# Patient Record
Sex: Female | Born: 1945 | Race: White | Hispanic: No | Marital: Married | Smoking: Never smoker
Health system: Southern US, Community
[De-identification: ages and names within clinical notes are randomized; demographics above are authoritative.]

## PROBLEM LIST (undated history)

## (undated) DIAGNOSIS — E079 Disorder of thyroid, unspecified: Secondary | ICD-10-CM

## (undated) DIAGNOSIS — J45909 Unspecified asthma, uncomplicated: Secondary | ICD-10-CM

## (undated) DIAGNOSIS — E039 Hypothyroidism, unspecified: Secondary | ICD-10-CM

## (undated) HISTORY — PX: ABDOMINAL HYSTERECTOMY: SHX81

---

## 1999-08-05 HISTORY — PX: NASAL POLYP SURGERY: SHX186

## 2018-09-07 ENCOUNTER — Emergency Department (HOSPITAL_BASED_OUTPATIENT_CLINIC_OR_DEPARTMENT_OTHER): Payer: PRIVATE HEALTH INSURANCE

## 2018-09-07 ENCOUNTER — Inpatient Hospital Stay (HOSPITAL_BASED_OUTPATIENT_CLINIC_OR_DEPARTMENT_OTHER)
Admission: EM | Admit: 2018-09-07 | Discharge: 2018-09-12 | DRG: 327 | Disposition: A | Discharge status: Home / Self Care | Payer: PRIVATE HEALTH INSURANCE | Attending: Family Medicine | Admitting: Family Medicine

## 2018-09-07 ENCOUNTER — Encounter (HOSPITAL_BASED_OUTPATIENT_CLINIC_OR_DEPARTMENT_OTHER): Payer: Self-pay

## 2018-09-07 ENCOUNTER — Other Ambulatory Visit: Payer: Self-pay

## 2018-09-07 ENCOUNTER — Inpatient Hospital Stay (HOSPITAL_COMMUNITY): Payer: PRIVATE HEALTH INSURANCE

## 2018-09-07 DIAGNOSIS — K440 Diaphragmatic hernia with obstruction, without gangrene: Principal | ICD-10-CM | POA: Diagnosis present

## 2018-09-07 DIAGNOSIS — K219 Gastro-esophageal reflux disease without esophagitis: Secondary | ICD-10-CM | POA: Diagnosis present

## 2018-09-07 DIAGNOSIS — Z7951 Long term (current) use of inhaled steroids: Secondary | ICD-10-CM | POA: Diagnosis not present

## 2018-09-07 DIAGNOSIS — E039 Hypothyroidism, unspecified: Secondary | ICD-10-CM

## 2018-09-07 DIAGNOSIS — Z8042 Family history of malignant neoplasm of prostate: Secondary | ICD-10-CM

## 2018-09-07 DIAGNOSIS — R7989 Other specified abnormal findings of blood chemistry: Secondary | ICD-10-CM | POA: Diagnosis present

## 2018-09-07 DIAGNOSIS — Z79890 Hormone replacement therapy (postmenopausal): Secondary | ICD-10-CM | POA: Diagnosis not present

## 2018-09-07 DIAGNOSIS — Z90710 Acquired absence of both cervix and uterus: Secondary | ICD-10-CM

## 2018-09-07 DIAGNOSIS — K311 Adult hypertrophic pyloric stenosis: Secondary | ICD-10-CM | POA: Diagnosis present

## 2018-09-07 DIAGNOSIS — Z789 Other specified health status: Secondary | ICD-10-CM

## 2018-09-07 DIAGNOSIS — J45909 Unspecified asthma, uncomplicated: Secondary | ICD-10-CM | POA: Diagnosis present

## 2018-09-07 DIAGNOSIS — Z880 Allergy status to penicillin: Secondary | ICD-10-CM

## 2018-09-07 DIAGNOSIS — Z806 Family history of leukemia: Secondary | ICD-10-CM

## 2018-09-07 DIAGNOSIS — R109 Unspecified abdominal pain: Secondary | ICD-10-CM | POA: Diagnosis not present

## 2018-09-07 DIAGNOSIS — Z79899 Other long term (current) drug therapy: Secondary | ICD-10-CM | POA: Diagnosis not present

## 2018-09-07 DIAGNOSIS — R778 Other specified abnormalities of plasma proteins: Secondary | ICD-10-CM | POA: Diagnosis present

## 2018-09-07 DIAGNOSIS — Z7289 Other problems related to lifestyle: Secondary | ICD-10-CM

## 2018-09-07 DIAGNOSIS — F1090 Alcohol use, unspecified, uncomplicated: Secondary | ICD-10-CM

## 2018-09-07 DIAGNOSIS — Z803 Family history of malignant neoplasm of breast: Secondary | ICD-10-CM

## 2018-09-07 DIAGNOSIS — Z885 Allergy status to narcotic agent status: Secondary | ICD-10-CM | POA: Diagnosis not present

## 2018-09-07 DIAGNOSIS — J4520 Mild intermittent asthma, uncomplicated: Secondary | ICD-10-CM | POA: Diagnosis not present

## 2018-09-07 DIAGNOSIS — K3189 Other diseases of stomach and duodenum: Secondary | ICD-10-CM | POA: Diagnosis present

## 2018-09-07 DIAGNOSIS — F1010 Alcohol abuse, uncomplicated: Secondary | ICD-10-CM | POA: Diagnosis present

## 2018-09-07 DIAGNOSIS — Z4659 Encounter for fitting and adjustment of other gastrointestinal appliance and device: Secondary | ICD-10-CM

## 2018-09-07 HISTORY — DX: Unspecified asthma, uncomplicated: J45.909

## 2018-09-07 HISTORY — DX: Hypothyroidism, unspecified: E03.9

## 2018-09-07 HISTORY — DX: Disorder of thyroid, unspecified: E07.9

## 2018-09-07 LAB — CBC
HCT: 42.6 % (ref 36.0–46.0)
Hemoglobin: 14.1 g/dL (ref 12.0–15.0)
MCH: 30.9 pg (ref 26.0–34.0)
MCHC: 33.1 g/dL (ref 30.0–36.0)
MCV: 93.4 fL (ref 80.0–100.0)
Platelets: 270 10*3/uL (ref 150–400)
RBC: 4.56 MIL/uL (ref 3.87–5.11)
RDW: 13.2 % (ref 11.5–15.5)
WBC: 7.1 10*3/uL (ref 4.0–10.5)
nRBC: 0 % (ref 0.0–0.2)

## 2018-09-07 LAB — COMPREHENSIVE METABOLIC PANEL
ALT: 13 U/L (ref 0–44)
AST: 22 U/L (ref 15–41)
Albumin: 3.7 g/dL (ref 3.5–5.0)
Alkaline Phosphatase: 50 U/L (ref 38–126)
Anion gap: 10 (ref 5–15)
BUN: 20 mg/dL (ref 8–23)
CHLORIDE: 94 mmol/L — AB (ref 98–111)
CO2: 27 mmol/L (ref 22–32)
CREATININE: 0.66 mg/dL (ref 0.44–1.00)
Calcium: 8.5 mg/dL — ABNORMAL LOW (ref 8.9–10.3)
GFR calc Af Amer: >60 mL/min (ref >60)
GFR calc non Af Amer: >60 mL/min (ref >60)
Glucose, Bld: 88 mg/dL (ref 70–99)
Potassium: 4 mmol/L (ref 3.5–5.1)
Sodium: 131 mmol/L — ABNORMAL LOW (ref 135–145)
Total Bilirubin: 2.1 mg/dL — ABNORMAL HIGH (ref 0.3–1.2)
Total Protein: 6.6 g/dL (ref 6.5–8.1)

## 2018-09-07 LAB — LIPASE, BLOOD: Lipase: 191 U/L — ABNORMAL HIGH (ref 11–51)

## 2018-09-07 LAB — URINALYSIS, ROUTINE W REFLEX MICROSCOPIC
Bilirubin Urine: NEGATIVE
Glucose, UA: NEGATIVE mg/dL
HGB URINE DIPSTICK: NEGATIVE
Ketones, ur: 40 mg/dL — AB
Nitrite: NEGATIVE
Protein, ur: NEGATIVE mg/dL
pH: 6 (ref 5.0–8.0)

## 2018-09-07 LAB — URINALYSIS, MICROSCOPIC (REFLEX)

## 2018-09-07 LAB — TROPONIN I
Troponin I: 0.05 ng/mL (ref <0.03)
Troponin I: 0.04 ng/mL (ref <0.03)

## 2018-09-07 LAB — LACTIC ACID, PLASMA: Lactic Acid, Venous: 1.4 mmol/L (ref 0.5–1.9)

## 2018-09-07 MED ORDER — THIAMINE HCL 100 MG/ML IJ SOLN
100.0000 mg | Freq: Every day | INTRAMUSCULAR | Status: DC
  Administered 2018-09-11 – 2018-09-12 (×2): 100 mg via INTRAVENOUS
  Filled 2018-09-07 (×2): qty 2

## 2018-09-07 MED ORDER — LEVOTHYROXINE SODIUM 100 MCG/5ML IV SOLN
50.0000 ug | Freq: Every day | INTRAVENOUS | Status: DC
  Administered 2018-09-08 – 2018-09-12 (×4): 50 ug via INTRAVENOUS
  Filled 2018-09-07 (×5): qty 5

## 2018-09-07 MED ORDER — VITAMIN B-1 100 MG PO TABS
100.0000 mg | ORAL_TABLET | Freq: Every day | ORAL | Status: DC
  Administered 2018-09-08 – 2018-09-09 (×2): 100 mg via ORAL
  Filled 2018-09-07 (×2): qty 1

## 2018-09-07 MED ORDER — ONDANSETRON HCL 4 MG/2ML IJ SOLN
4.0000 mg | Freq: Three times a day (TID) | INTRAMUSCULAR | Status: DC | PRN
  Administered 2018-09-07 – 2018-09-10 (×2): 4 mg via INTRAVENOUS
  Filled 2018-09-07 (×2): qty 2

## 2018-09-07 MED ORDER — ACETAMINOPHEN 650 MG RE SUPP: 650.0000 mg | Freq: Four times a day (QID) | RECTAL | Status: DC | PRN

## 2018-09-07 MED ORDER — FOLIC ACID 1 MG PO TABS
1.0000 mg | ORAL_TABLET | Freq: Every day | ORAL | Status: DC
  Administered 2018-09-08 – 2018-09-12 (×4): 1 mg via ORAL
  Filled 2018-09-07 (×4): qty 1

## 2018-09-07 MED ORDER — LORAZEPAM 2 MG/ML IJ SOLN: 0.0000 mg | Freq: Four times a day (QID) | INTRAMUSCULAR | Status: AC

## 2018-09-07 MED ORDER — NITROGLYCERIN 0.4 MG SL SUBL: 0.4000 mg | SUBLINGUAL_TABLET | SUBLINGUAL | Status: DC | PRN

## 2018-09-07 MED ORDER — LIDOCAINE VISCOUS HCL 2 % MT SOLN
OROMUCOSAL | Status: AC
  Filled 2018-09-07: qty 15

## 2018-09-07 MED ORDER — ADULT MULTIVITAMIN W/MINERALS CH
1.0000 | ORAL_TABLET | Freq: Every day | ORAL | Status: DC
  Administered 2018-09-08 – 2018-09-12 (×4): 1 via ORAL
  Filled 2018-09-07 (×4): qty 1

## 2018-09-07 MED ORDER — LORAZEPAM 2 MG/ML IJ SOLN
1.0000 mg | Freq: Four times a day (QID) | INTRAMUSCULAR | Status: AC | PRN
  Administered 2018-09-08: 1 mg via INTRAVENOUS
  Filled 2018-09-07: qty 1

## 2018-09-07 MED ORDER — BENZOCAINE 20 % MT AERO
INHALATION_SPRAY | OROMUCOSAL | Status: AC
  Filled 2018-09-07: qty 57

## 2018-09-07 MED ORDER — LORAZEPAM 1 MG PO TABS: 1.0000 mg | ORAL_TABLET | Freq: Four times a day (QID) | ORAL | Status: AC | PRN

## 2018-09-07 MED ORDER — ALBUTEROL SULFATE (2.5 MG/3ML) 0.083% IN NEBU: 2.5000 mg | INHALATION_SOLUTION | RESPIRATORY_TRACT | Status: DC | PRN

## 2018-09-07 MED ORDER — PANTOPRAZOLE SODIUM 40 MG IV SOLR
40.0000 mg | Freq: Once | INTRAVENOUS | Status: AC
  Administered 2018-09-07: 40 mg via INTRAVENOUS
  Filled 2018-09-07: qty 40

## 2018-09-07 MED ORDER — ACETAMINOPHEN 325 MG PO TABS: 650.0000 mg | ORAL_TABLET | Freq: Four times a day (QID) | ORAL | Status: DC | PRN

## 2018-09-07 MED ORDER — ONDANSETRON HCL 4 MG/2ML IJ SOLN
4.0000 mg | Freq: Once | INTRAMUSCULAR | Status: AC
  Administered 2018-09-07: 4 mg via INTRAVENOUS
  Filled 2018-09-07: qty 2

## 2018-09-07 MED ORDER — PANTOPRAZOLE SODIUM 40 MG IV SOLR
40.0000 mg | Freq: Two times a day (BID) | INTRAVENOUS | Status: DC
  Administered 2018-09-08 – 2018-09-12 (×8): 40 mg via INTRAVENOUS
  Filled 2018-09-07 (×8): qty 40

## 2018-09-07 MED ORDER — SODIUM CHLORIDE 0.9% FLUSH
3.0000 mL | Freq: Once | INTRAVENOUS | Status: DC
  Filled 2018-09-07: qty 3

## 2018-09-07 MED ORDER — MORPHINE SULFATE (PF) 4 MG/ML IV SOLN
4.0000 mg | Freq: Once | INTRAVENOUS | Status: DC
  Filled 2018-09-07: qty 1

## 2018-09-07 MED ORDER — LORAZEPAM 2 MG/ML IJ SOLN: 0.0000 mg | Freq: Two times a day (BID) | INTRAMUSCULAR | Status: AC

## 2018-09-07 MED ORDER — LACTATED RINGERS IV BOLUS
1000.0000 mL | Freq: Once | INTRAVENOUS | Status: AC
  Administered 2018-09-07: 1000 mL via INTRAVENOUS

## 2018-09-07 MED ORDER — FLUTICASONE FUROATE-VILANTEROL 100-25 MCG/INH IN AEPB
1.0000 | INHALATION_SPRAY | Freq: Every day | RESPIRATORY_TRACT | Status: DC
  Administered 2018-09-08 – 2018-09-09 (×2): 1 via RESPIRATORY_TRACT
  Filled 2018-09-07: qty 28

## 2018-09-07 MED ORDER — SODIUM CHLORIDE 0.9 % IV SOLN
INTRAVENOUS | Status: DC
  Administered 2018-09-07 – 2018-09-08 (×2): via INTRAVENOUS

## 2018-09-07 MED ORDER — IOPAMIDOL (ISOVUE-300) INJECTION 61%
100.0000 mL | Freq: Once | INTRAVENOUS | Status: AC | PRN
  Administered 2018-09-07: 100 mL via INTRAVENOUS

## 2018-09-07 NOTE — ED Notes (Signed)
C/o vomiting x 5 days  w increased abd pain and abd distension

## 2018-09-07 NOTE — ED Notes (Signed)
Report given to [facility] 

## 2018-09-07 NOTE — ED Notes (Signed)
Patient transported to CT 

## 2018-09-07 NOTE — ED Notes (Signed)
[facility] notified (Tara) - patient ready for transport 

## 2018-09-07 NOTE — ED Notes (Signed)
Called lab and let them know to add on troponin

## 2018-09-07 NOTE — ED Provider Notes (Signed)
[facility] EMERGENCY DEPARTMENT Provider Note   CSN: [id number] Arrival date & time: 09/07/18  1317     History   Chief Complaint Chief Complaint  Patient presents with  . Abdominal Pain    HPI Elizabeth Banks is a 73 y.o. female.  Patient is a 73 year old female with past medical history of asthma, thyroid disease, hiatal hernia who presents emergency department for epigastric pain, nausea, vomiting.  She reports that she resides in [place] but has been visiting her son in the past 6 weeks.  Reports of the last 5 days she has had uncontrollable vomiting, nausea, epigastric pain.  Denies any fever, chills, diarrhea, dysuria.  Reports that in the past she has had some similar episodes but not this severe and never associated with epigastric pain.  She reports that she has seen GI specialist in the past who has done upper and lower GI studies, barium studies.  She reports that this revealed a hiatal hernia but no other significant problems.  Reports that she is on "a stomach medicine".  But does not know what it is.  Reports that she thinks this episode might of been flared up by eating a lot of pizza over the weekend.  She reports that she felt dehydrated and had a family friend gave her some IV fluids and some Phenergan last night which did improve her but that she is still having some decreased urine output today.  She denies any chest pain, shortness of breath, bloody vomit, bilious vomit.     Past Medical History:  Diagnosis Date  . Asthma   . Thyroid disease     Patient Active Problem List   Diagnosis Date Noted  . 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 ABDOMINAL HYSTERECTOMY    . NASAL POLYP SURGERY  2001     OB History   No obstetric history on file.      Home Medications    Prior to Admission medications   Medication Sig Start Date End Date Taking? Authorizing Provider  IRON PO Take by mouth.   Yes  [provider]  fluticasone furoate-vilanterol (BREO ELLIPTA) 100-25 MCG/INH AEPB Inhale into the lungs.    [provider]  levothyroxine (SYNTHROID, LEVOTHROID) 100 MCG tablet Take by mouth.    [provider]  pantoprazole (PROTONIX) 40 MG tablet Take by mouth.    [provider]    Family History No family history on file.  Social History Social History   Tobacco Use  . Smoking status: Never Smoker  . Smokeless tobacco: Never Used  Substance Use Topics  . Alcohol use: Yes    Comment: occ  . Drug use: Never     Allergies   Morphine and related; Other; and Penicillins   Review of Systems Review of Systems  Constitutional: Positive for appetite change. Negative for activity change, chills, diaphoresis, fatigue, fever and unexpected weight change.  HENT: Negative.   Respiratory: Negative for cough and shortness of breath.   Cardiovascular: Negative for chest pain.  Gastrointestinal: Positive for abdominal pain, nausea and vomiting. Negative for blood in stool, constipation, diarrhea and rectal pain.  Endocrine: Negative for polyuria.  Genitourinary: Positive for decreased urine volume. Negative for difficulty urinating, dysuria, flank pain, pelvic pain, urgency, vaginal bleeding, vaginal discharge and vaginal pain.  Musculoskeletal: Negative for arthralgias, back pain and myalgias.  Skin: Negative for rash.  Allergic/Immunologic: Negative for immunocompromised state.  Neurological: Negative for  dizziness, weakness, light-headedness and headaches.  Hematological: Negative for adenopathy. Does not bruise/bleed easily.     Physical Exam Updated Vital Signs BP (!) 145/60   Pulse 70   Temp 98.7 F (37.1 C) (Oral)   Resp 11   Ht 4\' 11"  (1.499 m)   Wt 57.2 kg   SpO2 92%   BMI 25.45 kg/m   Physical Exam Vitals signs and nursing note reviewed.  Constitutional:      General: She is not in acute distress.    Appearance: She is  well-developed and normal weight. She is not ill-appearing, toxic-appearing or diaphoretic.  HENT:     Head: Normocephalic and atraumatic.     Nose: Nose normal.     Mouth/Throat:     Mouth: Mucous membranes are moist.  Eyes:     Conjunctiva/sclera: Conjunctivae normal.     Pupils: Pupils are equal, round, and reactive to light.  Cardiovascular:     Rate and Rhythm: Normal rate and regular rhythm.     Heart sounds: Normal heart sounds.  Pulmonary:     Effort: Pulmonary effort is normal.     Breath sounds: Normal breath sounds. No wheezing, rhonchi or rales.  Abdominal:     General: Abdomen is flat. Bowel sounds are normal. There is no distension.     Palpations: Abdomen is soft.     Tenderness: There is abdominal tenderness in the epigastric area. There is no right CVA tenderness, left CVA tenderness, guarding or rebound. Negative signs include Murphy's sign.  Musculoskeletal:     Right lower leg: No edema.     Left lower leg: No edema.  Skin:    General: Skin is warm.     Capillary Refill: Capillary refill takes less than 2 seconds.  Neurological:     General: No focal deficit present.     Mental Status: She is alert.  Psychiatric:        Mood and Affect: Mood normal.      ED Treatments / Results  Labs (all labs ordered are listed, but only abnormal results are displayed) Labs Reviewed  TROPONIN I - Abnormal; Notable for the following components:      Result Value   Troponin I 0.05 (*)    All other components within normal limits  LIPASE, BLOOD - Abnormal; Notable for the following components:   Lipase 191 (*)    All other components within normal limits  URINALYSIS, ROUTINE W REFLEX MICROSCOPIC - Abnormal; Notable for the following components:   Specific Gravity, Urine <1.005 (*)    Ketones, ur 40 (*)    Leukocytes, UA TRACE (*)    All other components within normal limits  COMPREHENSIVE METABOLIC PANEL - Abnormal; Notable for the following components:   Sodium  131 (*)    Chloride 94 (*)    Calcium 8.5 (*)    Total Bilirubin 2.1 (*)    All other components within normal limits  TROPONIN I - Abnormal; Notable for the following components:   Troponin I 0.04 (*)    All other components within normal limits  URINALYSIS, MICROSCOPIC (REFLEX) - Abnormal; Notable for the following components:   Bacteria, UA RARE (*)    All other components within normal limits  CBC  LACTIC ACID, PLASMA    EKG EKG Interpretation  Date/Time:  Tuesday September 07 2018 13:34:19 EST Ventricular Rate:  82 PR Interval:  146 QRS Duration: 88 QT Interval:  394 QTC Calculation: 460 R Axis:  55 Text Interpretation:  Normal sinus rhythm Right atrial enlargement Borderline ECG No old tracing to compare Confirmed by Linwood DibblesKnapp, Jon [id number]) on 09/07/2018 1:38:37 PM   Radiology Dg Chest 2 View  Result Date: 09/07/2018 CLINICAL DATA:  Chest pain and vomiting EXAM: CHEST - 2 VIEW COMPARISON:  None. FINDINGS: There is an air-fluid level above the diaphragm posterior to the right heart. There is also an apparent gastric air bubble on the left. There is atelectatic change in each lung base. The lungs elsewhere are clear. Heart size and pulmonary vascularity normal. No adenopathy. No bone lesions. IMPRESSION: Air-fluid level posterior to the right heart. Question large bulla containing fluid. Atypical lung abscess could present in this manner. An atypical appearing hernia is also a differential consideration. Note that there is a suspected gastric air bubble in the expected location in the left upper quadrant. Given this unusual appearance in the absence of prior studies to compare, CT of the chest and upper abdomen, ideally with oral contrast, is felt to be advisable to further assess. Mild bibasilar atelectasis. No frank airspace consolidation evident. Heart size normal. Electronically Signed   By: Bretta BangWilliam  Woodruff III M.D.   On: 09/07/2018 13:57   Ct Chest W Contrast  Result Date:  09/07/2018 CLINICAL DATA:  Epigastric pain.  Nausea and vomiting. EXAM: CT CHEST, ABDOMEN, AND PELVIS WITH CONTRAST TECHNIQUE: Multidetector CT imaging of the chest, abdomen and pelvis was performed following the standard protocol during bolus administration of intravenous contrast. CONTRAST:  100mL ISOVUE-300 IOPAMIDOL (ISOVUE-300) INJECTION 61% COMPARISON:  Two-view chest x-ray of the same day. FINDINGS: CT CHEST FINDINGS Cardiovascular: Heart size is normal. Coronary artery calcifications are present. A 3 vessel arch configuration is present. Atherosclerotic calcifications are present at the arch and in the descending aorta without aneurysm. Pulmonary arteries are within normal limits. Mediastinum/Nodes: Subcentimeter paratracheal lymph nodes are present. No significant mediastinal or hilar adenopathy is present. There is no significant axillary adenopathy. Thoracic inlet is normal. There is some inflammatory change of the distal esophagus with wall thickening. Lungs/Pleura: Atelectasis is present at the right base adjacent to the herniated stomach. The lungs are otherwise clear. No focal nodule, mass, or airspace disease is present. There is no significant pleural effusion or pneumothorax. Musculoskeletal: Vertebral body heights alignment are normal. No focal lytic or blastic lesions are present. Mild endplate degenerative changes are noted in the thoracic spine. CT ABDOMEN PELVIS FINDINGS Hepatobiliary: No focal liver abnormality is seen. No gallstones, gallbladder wall thickening, or biliary dilatation. Pancreas: Unremarkable. No pancreatic ductal dilatation or surrounding inflammatory changes. Spleen: Normal in size without focal abnormality. Adrenals/Urinary Tract: Adrenal glands are unremarkable. Kidneys are normal, without renal calculi, focal lesion, or hydronephrosis. Bladder is unremarkable. Stomach/Bowel: The stomach is massively dilated. The antrum extends superior to the GE junction within a  paraesophageal hernia. The duodenum extends superiorly into the hernia is well. Duodenum is normal in caliber. Small bowel is unremarkable. Distal small bowel is collapsed. Diverticular changes are present in the scratched at the ascending and transverse colon are within normal limits. Diverticular changes are present in the distal descending and sigmoid colon without inflammatory change. There is collapse the distal sigmoid colon. Vascular/Lymphatic: Atherosclerotic changes are present within the aorta and branch vessels. There is no aneurysm. Reproductive: Status post hysterectomy. No adnexal masses. Other: No other ventral hernia or free fluid is present. Musculoskeletal: Vertebral body heights alignment are normal. Degenerative changes are noted at the SI joints bilaterally and in the lower lumbar spine  facets. Hips are located and normal. IMPRESSION: 1. Marked dilation of the stomach with extension of the antrum into a paraesophageal hernia suggesting gastric volvulus. The stomach is obstructed. 2. Distal bowel is mostly collapsed. 3. Atelectasis the lung bases bilaterally. 4. Inflammatory changes involving the distal esophagus and proximal duodenum, potentially ischemic. 5.  Aortic Atherosclerosis (ICD10-I70.0). 6. Coronary artery disease These results were called by telephone at the time of interpretation on 09/07/2018 at 5:10 pm to Dr. Theda Belfast , who verbally acknowledged these results. Electronically Signed   By: Marin Roberts M.D.   On: 09/07/2018 17:11   Ct Abdomen Pelvis W Contrast  Result Date: 09/07/2018 CLINICAL DATA:  Epigastric pain.  Nausea and vomiting. EXAM: CT CHEST, ABDOMEN, AND PELVIS WITH CONTRAST TECHNIQUE: Multidetector CT imaging of the chest, abdomen and pelvis was performed following the standard protocol during bolus administration of intravenous contrast. CONTRAST:  ISOVUE-300 IOPAMIDOL (ISOVUE-300) INJECTION 61% COMPARISON:  Two-view chest x-ray of the same day.  FINDINGS: CT CHEST FINDINGS Cardiovascular: Heart size is normal. Coronary artery calcifications are present. A 3 vessel arch configuration is present. Atherosclerotic calcifications are present at the arch and in the descending aorta without aneurysm. Pulmonary arteries are within normal limits. Mediastinum/Nodes: Subcentimeter paratracheal lymph nodes are present. No significant mediastinal or hilar adenopathy is present. There is no significant axillary adenopathy. Thoracic inlet is normal. There is some inflammatory change of the distal esophagus with wall thickening. Lungs/Pleura: Atelectasis is present at the right base adjacent to the herniated stomach. The lungs are otherwise clear. No focal nodule, mass, or airspace disease is present. There is no significant pleural effusion or pneumothorax. Musculoskeletal: Vertebral body heights alignment are normal. No focal lytic or blastic lesions are present. Mild endplate degenerative changes are noted in the thoracic spine. CT ABDOMEN PELVIS FINDINGS Hepatobiliary: No focal liver abnormality is seen. No gallstones, gallbladder wall thickening, or biliary dilatation. Pancreas: Unremarkable. No pancreatic ductal dilatation or surrounding inflammatory changes. Spleen: Normal in size without focal abnormality. Adrenals/Urinary Tract: Adrenal glands are unremarkable. Kidneys are normal, without renal calculi, focal lesion, or hydronephrosis. Bladder is unremarkable. Stomach/Bowel: The stomach is massively dilated. The antrum extends superior to the GE junction within a paraesophageal hernia. The duodenum extends superiorly into the hernia is well. Duodenum is normal in caliber. Small bowel is unremarkable. Distal small bowel is collapsed. Diverticular changes are present in the scratched at the ascending and transverse colon are within normal limits. Diverticular changes are present in the distal descending and sigmoid colon without inflammatory change. There is  collapse the distal sigmoid colon. Vascular/Lymphatic: Atherosclerotic changes are present within the aorta and branch vessels. There is no aneurysm. Reproductive: Status post hysterectomy. No adnexal masses. Other: No other ventral hernia or free fluid is present. Musculoskeletal: Vertebral body heights alignment are normal. Degenerative changes are noted at the SI joints bilaterally and in the lower lumbar spine facets. Hips are located and normal. IMPRESSION: 1. Marked dilation of the stomach with extension of the antrum into a paraesophageal hernia suggesting gastric volvulus. The stomach is obstructed. 2. Distal bowel is mostly collapsed. 3. Atelectasis the lung bases bilaterally. 4. Inflammatory changes involving the distal esophagus and proximal duodenum, potentially ischemic. 5.  Aortic Atherosclerosis (ICD10-I70.0). 6. Coronary artery disease These results were called by telephone at the time of interpretation on 09/07/2018 at 5:10 pm to Dr. Theda Belfast , who verbally acknowledged these results. Electronically Signed   By: Marin Roberts M.D.   On: 09/07/2018 17:11  Procedures Procedures (including critical care time)  Medications Ordered in ED Medications  Benzocaine (HURRCAINE) 20 % mouth spray (has no administration in time range)  lidocaine (XYLOCAINE) 2 % viscous mouth solution (has no administration in time range)  morphine 4 MG/ML injection 4 mg (4 mg Intravenous Refused 09/07/18 1930)  lactated ringers bolus 1,000 mL (0 mLs Intravenous Stopped 09/07/18 1720)  ondansetron (ZOFRAN) injection 4 mg (4 mg Intravenous Given 09/07/18 1530)  pantoprazole (PROTONIX) injection 40 mg (40 mg Intravenous Given 09/07/18 1532)  iopamidol (ISOVUE-300) 61 % injection 100 mL (100 mLs Intravenous Contrast Given 09/07/18 1626)  ondansetron (ZOFRAN) injection 4 mg (4 mg Intravenous Given 09/07/18 1922)     Initial Impression / Assessment and Plan / ED Course  I have reviewed the triage vital signs and  the nursing notes.  Pertinent labs & imaging results that were available during my care of the patient were reviewed by me and considered in my medical decision making (see chart for details).  Clinical Course as of Sep 07 2001  Tue Sep 07, 2018  1420 Case discussed with Dr. Lynelle Doctor. Plan agreed upon   [KM]  1718 Consult placed for general surgeon based on CT findings. Patient has gastric volvulus with gastric obstruction. Currently NPO. Currently she is stable, pain controlled.    [KM]  1814 Spoke with Dr. Derrell Lolling, surgery who recommended NPO and NG tube. Dr. Mikeal Hawthorne to admit the patient on medicine floor. Patient remains stable.    [KM]  1903 Unable to place NG tube due to patient hx of nasal polyps. Ordering pain meds at this time.    [KM]    Clinical Course User Index [KM] Arlyn Dunning, PA-C    Patient care passed to Hospitalist, and Dr. Rockne Coons, waiting for transport to [facility]      Clinical Impression: 1. Gastric volvulus     Disposition: Admit    This note was prepared with assistance of Dragon voice recognition software. Occasional wrong-word or sound-a-like substitutions may have occurred due to the inherent limitations of voice recognition software.   Final Clinical Impressions(s) / ED Diagnoses   Final diagnoses:  Gastric volvulus    ED Discharge Orders    None       Jeral Pinch 09/07/18 2003    Tegeler, Canary Brim, MD 09/08/18 [phone]

## 2018-09-07 NOTE — ED Triage Notes (Signed)
C/o epigastric pain, n/x x 3 days-NAD- to triage in w/c-family states pt was given IVF at home yesterday

## 2018-09-07 NOTE — ED Notes (Signed)
attempted to call report to [facility] nurse not available

## 2018-09-07 NOTE — Consult Note (Signed)
Surgical Consultation Requesting provider: Dr. Mikeal Hawthorne  CC: gastric outlet obstruction  HPI: This is a relatively healthy 72yo woman who has been experiencing nausea, emesis, epigastric fullness/ pain since Thursday last week (6 days). She has had similar symptoms last year and sought evaluation by a GI- by the time she saw him and got worked up her symptoms had resolved, imaging studies did reveal a hiatal hernia but nothing concerning and she was told she had cyclic vomiting syndrome. She was prescribed some type of medication and has had no issues until now. She denies fevers. Her last emesis was yesterday afternoon. She has not been able to tolerate any PO. She continues to feel a pressure-like pain in her upper abdomen. CT shows GOO secondary to hiatal hernia. NG insertion via left nare unsuccessful in [facility] due to hx of nasal polyps; they did not try the right side.  No prior abdominal surgeries.  She lives in [place]- was here visiting her son for 6 wks and was due to go home this Friday.   Allergies  Allergen Reactions  . Morphine And Related Other (See Comments)    hallucinations  . Other     "some pain pill"  . Penicillins     Past Medical History:  Diagnosis Date  . Asthma   . Thyroid disease     Past Surgical History:  Procedure Laterality Date  . ABDOMINAL HYSTERECTOMY    . NASAL POLYP SURGERY  2001    No family history on file.  Social History   Socioeconomic History  . Marital status: Married    Spouse name: Not on file  . Number of children: Not on file  . Years of education: Not on file  . Highest education level: Not on file  Occupational History  . Not on file  Social Needs  . Financial resource strain: Not on file  . Food insecurity:    Worry: Not on file    Inability: Not on file  . Transportation needs:    Medical: Not on file    Non-medical: Not on file  Tobacco Use  . Smoking status: Never Smoker  . Smokeless tobacco: Never Used   Substance and Sexual Activity  . Alcohol use: Yes    Comment: occ  . Drug use: Never  . Sexual activity: Not on file  Lifestyle  . Physical activity:    Days per week: Not on file    Minutes per session: Not on file  . Stress: Not on file  Relationships  . Social connections:    Talks on phone: Not on file    Gets together: Not on file    Attends religious service: Not on file    Active member of club or organization: Not on file    Attends meetings of clubs or organizations: Not on file    Relationship status: Not on file  Other Topics Concern  . Not on file  Social History Narrative  . Not on file    No current facility-administered medications on file prior to encounter.    Current Outpatient Medications on File Prior to Encounter  Medication Sig Dispense Refill  . IRON PO Take by mouth.    . fluticasone furoate-vilanterol (BREO ELLIPTA) 100-25 MCG/INH AEPB Inhale into the lungs.    Marland Kitchen levothyroxine (SYNTHROID, LEVOTHROID) 100 MCG tablet Take by mouth.    . pantoprazole (PROTONIX) 40 MG tablet Take by mouth.      Review of Systems: a complete,  10pt review of systems was completed with pertinent positives and negatives as documented in the HPI  Physical Exam: Vitals:   09/07/18 1800 09/07/18 2109  BP: (!) 145/60 (!) 143/74  Pulse: 71 70  Resp: 11   Temp:  99 F (37.2 C)  SpO2: 94% 96%   Gen: A&Ox3, no distress  Head: normocephalic, atraumatic Eyes: extraocular motions intact, anicteric.  Neck: supple without mass or thyromegaly Chest: unlabored respirations, symmetrical air entry, clear bilaterally   Cardiovascular: RRR with palpable distal pulses, no pedal edema Abdomen: soft, mildly distended and mildly tender upper abdomen without guarding or peritonitis. No mass or organomegaly.  Extremities: warm, without edema, no deformities  Neuro: grossly intact Psych: appropriate mood and affect, normal insight  Skin: warm and dry   CBC Latest Ref Rng & Units  09/07/2018  WBC 4.0 - 10.5 K/uL 7.1  Hemoglobin 12.0 - 15.0 g/dL 09.8  Hematocrit 11.9 - 46.0 % 42.6  Platelets 150 - 400 K/uL 270    CMP Latest Ref Rng & Units 09/07/2018  Glucose 70 - 99 mg/dL 88  BUN 8 - 23 mg/dL 20  Creatinine 1.47 - 8.29 mg/dL 5.62  Sodium 130 - 865 mmol/L 131(L)  Potassium 3.5 - 5.1 mmol/L 4.0  Chloride 98 - 111 mmol/L 94(L)  CO2 22 - 32 mmol/L 27  Calcium 8.9 - 10.3 mg/dL 7.8(I)  Total Protein 6.5 - 8.1 g/dL 6.6  Total Bilirubin 0.3 - 1.2 mg/dL 2.1(H)  Alkaline Phos 38 - 126 U/L 50  AST 15 - 41 U/L 22  ALT 0 - 44 U/L 13    No results found for: INR, PROTIME  Imaging: Dg Chest 2 View  Result Date: 09/07/2018 CLINICAL DATA:  Chest pain and vomiting EXAM: CHEST - 2 VIEW COMPARISON:  None. FINDINGS: There is an air-fluid level above the diaphragm posterior to the right heart. There is also an apparent gastric air bubble on the left. There is atelectatic change in each lung base. The lungs elsewhere are clear. Heart size and pulmonary vascularity normal. No adenopathy. No bone lesions. IMPRESSION: Air-fluid level posterior to the right heart. Question large bulla containing fluid. Atypical lung abscess could present in this manner. An atypical appearing hernia is also a differential consideration. Note that there is a suspected gastric air bubble in the expected location in the left upper quadrant. Given this unusual appearance in the absence of prior studies to compare, CT of the chest and upper abdomen, ideally with oral contrast, is felt to be advisable to further assess. Mild bibasilar atelectasis. No frank airspace consolidation evident. Heart size normal. Electronically Signed   By: Bretta Bang III M.D.   On: 09/07/2018 13:57   Ct Chest W Contrast  Result Date: 09/07/2018 CLINICAL DATA:  Epigastric pain.  Nausea and vomiting. EXAM: CT CHEST, ABDOMEN, AND PELVIS WITH CONTRAST TECHNIQUE: Multidetector CT imaging of the chest, abdomen and pelvis was performed  following the standard protocol during bolus administration of intravenous contrast. CONTRAST:  ISOVUE-300 IOPAMIDOL (ISOVUE-300) INJECTION 61% COMPARISON:  Two-view chest x-ray of the same day. FINDINGS: CT CHEST FINDINGS Cardiovascular: Heart size is normal. Coronary artery calcifications are present. A 3 vessel arch configuration is present. Atherosclerotic calcifications are present at the arch and in the descending aorta without aneurysm. Pulmonary arteries are within normal limits. Mediastinum/Nodes: Subcentimeter paratracheal lymph nodes are present. No significant mediastinal or hilar adenopathy is present. There is no significant axillary adenopathy. Thoracic inlet is normal. There is some inflammatory change  of the distal esophagus with wall thickening. Lungs/Pleura: Atelectasis is present at the right base adjacent to the herniated stomach. The lungs are otherwise clear. No focal nodule, mass, or airspace disease is present. There is no significant pleural effusion or pneumothorax. Musculoskeletal: Vertebral body heights alignment are normal. No focal lytic or blastic lesions are present. Mild endplate degenerative changes are noted in the thoracic spine. CT ABDOMEN PELVIS FINDINGS Hepatobiliary: No focal liver abnormality is seen. No gallstones, gallbladder wall thickening, or biliary dilatation. Pancreas: Unremarkable. No pancreatic ductal dilatation or surrounding inflammatory changes. Spleen: Normal in size without focal abnormality. Adrenals/Urinary Tract: Adrenal glands are unremarkable. Kidneys are normal, without renal calculi, focal lesion, or hydronephrosis. Bladder is unremarkable. Stomach/Bowel: The stomach is massively dilated. The antrum extends superior to the GE junction within a paraesophageal hernia. The duodenum extends superiorly into the hernia is well. Duodenum is normal in caliber. Small bowel is unremarkable. Distal small bowel is collapsed. Diverticular changes are present  in the scratched at the ascending and transverse colon are within normal limits. Diverticular changes are present in the distal descending and sigmoid colon without inflammatory change. There is collapse the distal sigmoid colon. Vascular/Lymphatic: Atherosclerotic changes are present within the aorta and branch vessels. There is no aneurysm. Reproductive: Status post hysterectomy. No adnexal masses. Other: No other ventral hernia or free fluid is present. Musculoskeletal: Vertebral body heights alignment are normal. Degenerative changes are noted at the SI joints bilaterally and in the lower lumbar spine facets. Hips are located and normal. IMPRESSION: 1. Marked dilation of the stomach with extension of the antrum into a paraesophageal hernia suggesting gastric volvulus. The stomach is obstructed. 2. Distal bowel is mostly collapsed. 3. Atelectasis the lung bases bilaterally. 4. Inflammatory changes involving the distal esophagus and proximal duodenum, potentially ischemic. 5.  Aortic Atherosclerosis (ICD10-I70.0). 6. Coronary artery disease These results were called by telephone at the time of interpretation on 09/07/2018 at 5:10 pm to Dr. Theda Belfast , who verbally acknowledged these results. Electronically Signed   By: Marin Roberts M.D.   On: 09/07/2018 17:11   Ct Abdomen Pelvis W Contrast  Result Date: 09/07/2018 CLINICAL DATA:  Epigastric pain.  Nausea and vomiting. EXAM: CT CHEST, ABDOMEN, AND PELVIS WITH CONTRAST TECHNIQUE: Multidetector CT imaging of the chest, abdomen and pelvis was performed following the standard protocol during bolus administration of intravenous contrast. CONTRAST:  ISOVUE-300 IOPAMIDOL (ISOVUE-300) INJECTION 61% COMPARISON:  Two-view chest x-ray of the same day. FINDINGS: CT CHEST FINDINGS Cardiovascular: Heart size is normal. Coronary artery calcifications are present. A 3 vessel arch configuration is present. Atherosclerotic calcifications are present at the arch  and in the descending aorta without aneurysm. Pulmonary arteries are within normal limits. Mediastinum/Nodes: Subcentimeter paratracheal lymph nodes are present. No significant mediastinal or hilar adenopathy is present. There is no significant axillary adenopathy. Thoracic inlet is normal. There is some inflammatory change of the distal esophagus with wall thickening. Lungs/Pleura: Atelectasis is present at the right base adjacent to the herniated stomach. The lungs are otherwise clear. No focal nodule, mass, or airspace disease is present. There is no significant pleural effusion or pneumothorax. Musculoskeletal: Vertebral body heights alignment are normal. No focal lytic or blastic lesions are present. Mild endplate degenerative changes are noted in the thoracic spine. CT ABDOMEN PELVIS FINDINGS Hepatobiliary: No focal liver abnormality is seen. No gallstones, gallbladder wall thickening, or biliary dilatation. Pancreas: Unremarkable. No pancreatic ductal dilatation or surrounding inflammatory changes. Spleen: Normal in size without focal abnormality.  Adrenals/Urinary Tract: Adrenal glands are unremarkable. Kidneys are normal, without renal calculi, focal lesion, or hydronephrosis. Bladder is unremarkable. Stomach/Bowel: The stomach is massively dilated. The antrum extends superior to the GE junction within a paraesophageal hernia. The duodenum extends superiorly into the hernia is well. Duodenum is normal in caliber. Small bowel is unremarkable. Distal small bowel is collapsed. Diverticular changes are present in the scratched at the ascending and transverse colon are within normal limits. Diverticular changes are present in the distal descending and sigmoid colon without inflammatory change. There is collapse the distal sigmoid colon. Vascular/Lymphatic: Atherosclerotic changes are present within the aorta and branch vessels. There is no aneurysm. Reproductive: Status post hysterectomy. No adnexal masses.  Other: No other ventral hernia or free fluid is present. Musculoskeletal: Vertebral body heights alignment are normal. Degenerative changes are noted at the SI joints bilaterally and in the lower lumbar spine facets. Hips are located and normal. IMPRESSION: 1. Marked dilation of the stomach with extension of the antrum into a paraesophageal hernia suggesting gastric volvulus. The stomach is obstructed. 2. Distal bowel is mostly collapsed. 3. Atelectasis the lung bases bilaterally. 4. Inflammatory changes involving the distal esophagus and proximal duodenum, potentially ischemic. 5.  Aortic Atherosclerosis (ICD10-I70.0). 6. Coronary artery disease These results were called by telephone at the time of interpretation on 09/07/2018 at 5:10 pm to Dr. Theda BelfastHRIS TEGELER , who verbally acknowledged these results. Electronically Signed   By: Marin Robertshristopher  Mattern M.D.   On: 09/07/2018 17:11     A/P: 72yo woman with gastric outlet obstruction secondary to hiatal hernia. 6 days of symptoms. No fever, leukocytosis, acidosis, AKI, or peritoneal signs to suggest ischemia.  -Recommend re-attempt at NG insertion to decompress stomach.  -Fluid resuscitation -Strict NPO -Pain/ nausea control  Will continue to follow. If GOO fails to resolve with decompression may require surgical reduction/ gastropexying gastrostomy tube this admission.    Phylliss Blakeshelsea Connor, MD [facility] [place] Pager [phone]

## 2018-09-07 NOTE — ED Notes (Signed)
Attempted NG tube in left nare and met a lot of resistance. Pt apparently has a hx of nasal polyps and has had sinus surgery.

## 2018-09-07 NOTE — ED Notes (Signed)
Date and time results received: 09/07/18 1935 (use smartphrase ".now" to insert current time)  Test: troponin Critical Value: 0.04  Name of Provider Notified:   Orders Received? Or Actions Taken?: no new orders

## 2018-09-07 NOTE — ED Notes (Signed)
Troponin 0.05, results given to ED PA and Sarah RN

## 2018-09-07 NOTE — ED Notes (Signed)
Friend gave her 1500 cc ns iv last pm

## 2018-09-07 NOTE — ED Notes (Signed)
Report given to [facility] RN [address]

## 2018-09-07 NOTE — Progress Notes (Signed)
Received patient from [facility] ED via [facility] accompanied by son and daughter-in-law.  PAtient AOx4, VSS with slightly elevated BP at 143/74, PR 70 and O2Sat at 96% on RA, still nauseous from the travel, oriented to room, bed controls and call light.  Text paged [facility] admission of patient's arrival to the floor and called that Dr. Clyde Lundborg will be the attending MD.  Patient and family members made aware.  Patient now lying on bed comfortably trying to rest. Will monitor and awaiting further orders.

## 2018-09-08 ENCOUNTER — Encounter (HOSPITAL_COMMUNITY): Payer: Self-pay | Admitting: Internal Medicine

## 2018-09-08 ENCOUNTER — Other Ambulatory Visit: Payer: Self-pay

## 2018-09-08 DIAGNOSIS — Z7289 Other problems related to lifestyle: Secondary | ICD-10-CM

## 2018-09-08 LAB — CBC
HCT: 36.3 % (ref 36.0–46.0)
Hemoglobin: 12 g/dL (ref 12.0–15.0)
MCH: 30.7 pg (ref 26.0–34.0)
MCHC: 33.1 g/dL (ref 30.0–36.0)
MCV: 92.8 fL (ref 80.0–100.0)
Platelets: 216 10*3/uL (ref 150–400)
RBC: 3.91 MIL/uL (ref 3.87–5.11)
RDW: 13 % (ref 11.5–15.5)
WBC: 5.7 10*3/uL (ref 4.0–10.5)
nRBC: 0 % (ref 0.0–0.2)

## 2018-09-08 LAB — APTT: aPTT: 29 seconds (ref 24–36)

## 2018-09-08 LAB — LIPID PANEL
Cholesterol: 208 mg/dL — ABNORMAL HIGH (ref 0–200)
HDL: 52 mg/dL (ref >40)
LDL Cholesterol: 143 mg/dL — ABNORMAL HIGH (ref 0–99)
Total CHOL/HDL Ratio: 4 RATIO
Triglycerides: 63 mg/dL (ref <150)
VLDL: 13 mg/dL (ref 0–40)

## 2018-09-08 LAB — COMPREHENSIVE METABOLIC PANEL
ALBUMIN: 2.8 g/dL — AB (ref 3.5–5.0)
ALT: 14 U/L (ref 0–44)
AST: 19 U/L (ref 15–41)
Alkaline Phosphatase: 35 U/L — ABNORMAL LOW (ref 38–126)
Anion gap: 11 (ref 5–15)
BUN: 11 mg/dL (ref 8–23)
CHLORIDE: 101 mmol/L (ref 98–111)
CO2: 23 mmol/L (ref 22–32)
Calcium: 7.8 mg/dL — ABNORMAL LOW (ref 8.9–10.3)
Creatinine, Ser: 0.77 mg/dL (ref 0.44–1.00)
GFR calc Af Amer: >60 mL/min (ref >60)
GFR calc non Af Amer: >60 mL/min (ref >60)
GLUCOSE: 69 mg/dL — AB (ref 70–99)
Potassium: 3.2 mmol/L — ABNORMAL LOW (ref 3.5–5.1)
Sodium: 135 mmol/L (ref 135–145)
Total Bilirubin: 1.7 mg/dL — ABNORMAL HIGH (ref 0.3–1.2)
Total Protein: 5.3 g/dL — ABNORMAL LOW (ref 6.5–8.1)

## 2018-09-08 LAB — PROTIME-INR
INR: 1.02
Prothrombin Time: 13.3 seconds (ref 11.4–15.2)

## 2018-09-08 LAB — TROPONIN I
TROPONIN I: 0.03 ng/mL — AB (ref <0.03)
Troponin I: 0.04 ng/mL (ref <0.03)
Troponin I: 0.03 ng/mL (ref <0.03)

## 2018-09-08 LAB — GLUCOSE, CAPILLARY: Glucose-Capillary: 80 mg/dL (ref 70–99)

## 2018-09-08 LAB — HEMOGLOBIN A1C
HEMOGLOBIN A1C: 5.8 % — AB (ref 4.8–5.6)
Mean Plasma Glucose: 119.76 mg/dL

## 2018-09-08 MED ORDER — KCL IN DEXTROSE-NACL 20-5-0.45 MEQ/L-%-% IV SOLN
INTRAVENOUS | Status: DC
  Administered 2018-09-08 – 2018-09-12 (×8): via INTRAVENOUS
  Filled 2018-09-08 (×10): qty 1000

## 2018-09-08 NOTE — Progress Notes (Addendum)
NGT 12Fr successfully placed on patient's right nare and confirmed by abdominal xray.  Initial output was 1000 mL pink-tinged with brown sediments, patient very relieved from the stomach discomfort and now watching TV.  Requested some medication to sleep. FYI  Administered PRN Ativan 1mg  IV for anxiety and insomnia at 0054 and noted effective.

## 2018-09-08 NOTE — Progress Notes (Addendum)
  Progress Note: General Surgery Service   Assessment/Plan: Principal Problem:   Abdominal pain Active Problems:   Gastric volvulus   Asthma   Hypothyroidism   Elevated troponin   Alcohol use  73 yo female with recurrent nausea/vomting found to have large paraesophageal hernia with majority of antrum and first portion of duodenum in the chest and obstructive symptoms. NG placed yesterday with >1l output and symptomatic improvement. -continue NG tube -plan for laparoscopic repair and gastrostomy tube insertion this week    LOS: 1 day  Chief Complaint/Subjective: Pain resolved, no nausea, feels much better, thinks this has happened 3 times before and each time has been more severe so she would like to proceed with surgical treatment  Objective: Vital signs in last 24 hours: Temp:  [97.7 F (36.5 C)-99 F (37.2 C)] 97.7 F (36.5 C) (02/05 0529) Pulse Rate:  [63-85] 63 (02/05 0529) Resp:  [9-18] 16 (02/05 0625) BP: (143-166)/(60-92) 160/73 (02/05 0529) SpO2:  [92 %-99 %] 99 % (02/05 0529) Weight:  [57.2 kg] 57.2 kg (02/04 1335) Last BM Date: 09/07/18  Intake/Output from previous day: 02/04 0701 - 02/05 0700 In: 1745.7 [I.V.:745.7; IV Piggyback:1000] Out: 1550 [Urine:300; Emesis/NG output:1250] Intake/Output this shift: No intake/output data recorded.  Lungs: nonlabored breathing  Cardiovascular: RRR  Abd: soft, NT, ND  Extremities: no edema  Neuro: AOx4  Lab Results: CBC  Recent Labs    09/07/18 1515 09/08/18 0656  WBC 7.1 5.7  HGB 14.1 12.0  HCT 42.6 36.3  PLT 270 216   BMET Recent Labs    09/07/18 1515 09/08/18 0656  NA 131* 135  K 4.0 3.2*  CL 94* 101  CO2 27 23  GLUCOSE 88 69*  BUN 20 11  CREATININE 0.66 0.77  CALCIUM 8.5* 7.8*   PT/INR Recent Labs    09/07/18 2312  LABPROT 13.3  INR 1.02   ABG No results for input(s): PHART, HCO3 in the last 72 hours.  Invalid input(s): PCO2,  PO2  Studies/Results:  Anti-infectives: Anti-infectives (From admission, onward)   None      Medications: Scheduled Meds: . fluticasone furoate-vilanterol  1 puff Inhalation Daily  . folic acid  1 mg Oral Daily  . levothyroxine  50 mcg Intravenous Daily  . LORazepam  0-4 mg Intravenous Q6H   Followed by  . [START ON 09/09/2018] LORazepam  0-4 mg Intravenous Q12H  .  morphine injection  4 mg Intravenous Once  . multivitamin with minerals  1 tablet Oral Daily  . pantoprazole  40 mg Intravenous Q12H  . thiamine  100 mg Oral Daily   Or  . thiamine  100 mg Intravenous Daily   Continuous Infusions: . sodium chloride 100 mL/hr at 09/08/18 0600   PRN Meds:.acetaminophen, albuterol, LORazepam **OR** LORazepam, nitroGLYCERIN, ondansetron (ZOFRAN) IV  Rodman Pickle, MD [facility]

## 2018-09-08 NOTE — H&P (Signed)
History and Physical    Elizabeth Banks ZOX:[id number] DOB: 06-09-1946 DOA: 09/07/2018  Referring MD/NP/PA:   PCP: Patient, No Pcp Per   Patient coming from:  The patient is coming from home.  At baseline, pt is independent for most of ADL.        Chief Complaint: Nausea, vomiting, abdominal pain  HPI: Elizabeth Banks is a 73 y.o. female with medical history significant of alcohol use, asthma, GERD, hypothyroidism, who presents with nausea, vomiting and abdominal pain.  Patient states that she has been having nausea, vomiting and abdominal pain for more than 5 days, which has been poor aggressively worsening.  Patient states mostly she has dry heaves, no diarrhea.  Her abdominal pain is located in epigastric area, constant, pressure and burning-like pain, 5 out of 10 severity, nonradiating.  Denies fever or chills.  Patient states that she had similar symptoms last year, and had negative GI work-up.  She was started on Protonix which was effective until 5 days ago.  Patient does not have chest pain, shortness of breath, cough.  No symptoms of UTI.  Patient states that she drinks 1 to 2 glasses of wine each day, several times per week.  ED Course: pt was found to have WBC 7.1, lipase 191, lactic acid 1.1, troponin 0 0.05, negative urinalysis, electrolytes renal function okay, temperature 99, no tachycardia, no tachypnea, oxygen saturation 92 to 96% on room air. CXR showed some abnormalities with unclear etiology, then CT of chest was done, which showed possible gastric vovulus and inflammatory changes involving the distal esophagus and proximal duodenum, potentially ischemic. Pt is admitted to med-surg bed as inpt.  General surgeon, Dr. Fredricka Bonine was consulted.  CT abdomen/pelvis showed: 1. Marked dilation of the stomach with extension of the antrum into a paraesophageal hernia suggesting gastric volvulus. The stomach is obstructed. 2. Distal bowel is mostly collapsed.  3. Atelectasis the lung bases  bilaterally. 4. Inflammatory changes involving the distal esophagus and proximal duodenum, potentially ischemic.  Review of Systems:   General: no fevers, chills, no body weight gain, has poor appetite, has fatigue HEENT: no blurry vision, hearing changes or sore throat Respiratory: no dyspnea, coughing, wheezing CV: no chest pain, no palpitations GI: has nausea, vomiting, abdominal pain, no diarrhea, constipation GU: no dysuria, burning on urination, increased urinary frequency, hematuria  Ext: no leg edema Neuro: no unilateral weakness, numbness, or tingling, no vision change or hearing loss Skin: no rash, no skin tear. MSK: No muscle spasm, no deformity, no limitation of range of movement in spin Heme: No easy bruising.  Travel history: No recent long distant travel.  Allergy:  Allergies  Allergen Reactions  . Morphine And Related Other (See Comments)    hallucinations  . Other     "some pain pill"  . Penicillins     Past Medical History:  Diagnosis Date  . Asthma   . Hypothyroidism   . Thyroid disease     Past Surgical History:  Procedure Laterality Date  . ABDOMINAL HYSTERECTOMY    . NASAL POLYP SURGERY  2001    Social History:  reports that she has never smoked. She has never used smokeless tobacco. She reports current alcohol use. She reports that she does not use drugs.  Family History:  Family History  Problem Relation Age of Onset  . Leukemia Mother   . Breast cancer Mother   . Prostate cancer Brother      Prior to Admission medications   Medication Sig Start  Date End Date Taking? Authorizing Provider  IRON PO Take by mouth.   Yes [provider]  fluticasone furoate-vilanterol (BREO ELLIPTA) 100-25 MCG/INH AEPB Inhale into the lungs.    [provider]  levothyroxine (SYNTHROID, LEVOTHROID) 100 MCG tablet Take by mouth.    [provider]  pantoprazole (PROTONIX) 40 MG tablet Take by mouth.    [provider]     Physical Exam: Vitals:   09/07/18 1700 09/07/18 1800 09/07/18 2109 09/07/18 2320  BP: (!) 154/74 (!) 145/60 (!) 143/74   Pulse: 65 71 70 77  Resp: (!) 9 11 18    Temp:   99 F (37.2 C)   TempSrc:   Oral   SpO2: 92% 94% 96%   Weight:      Height:       General: Not in acute distress HEENT:       Eyes: PERRL, EOMI, no scleral icterus.       ENT: No discharge from the ears and nose, no pharynx injection, no tonsillar enlargement.        Neck: No JVD, no bruit, no mass felt. Heme: No neck lymph node enlargement. Cardiac: S1/S2, RRR, No murmurs, No gallops or rubs. Respiratory:  No rales, wheezing, rhonchi or rubs. GI: Soft, nondistended, has tenderness in epigastric area, no rebound pain, no organomegaly, BS present. GU: No hematuria Ext: No pitting leg edema bilaterally. 2+DP/PT pulse bilaterally. Musculoskeletal: No joint deformities, No joint redness or warmth, no limitation of ROM in spin. Skin: No rashes.  Neuro: Alert, oriented X3, cranial nerves II-XII grossly intact, moves all extremities normally. Psych: Patient is not psychotic, no suicidal or hemocidal ideation.  Labs on Admission: I have personally reviewed following labs and imaging studies  CBC: Recent Labs  Lab 09/07/18 1515  WBC 7.1  HGB 14.1  HCT 42.6  MCV 93.4  PLT 270   Basic Metabolic Panel: Recent Labs  Lab 09/07/18 1515  NA 131*  K 4.0  CL 94*  CO2 27  GLUCOSE 88  BUN 20  CREATININE 0.66  CALCIUM 8.5*   GFR: Estimated Creatinine Clearance: 47.5 mL/min (by C-G formula based on SCr of 0.66 mg/dL). Liver Function Tests: Recent Labs  Lab 09/07/18 1515  AST 22  ALT 13  ALKPHOS 50  BILITOT 2.1*  PROT 6.6  ALBUMIN 3.7   Recent Labs  Lab 09/07/18 1515  LIPASE 191*   No results for input(s): AMMONIA in the last 168 hours. Coagulation Profile: Recent Labs  Lab 09/07/18 2312  INR 1.02   Cardiac Enzymes: Recent Labs  Lab 09/07/18 1515 09/07/18 1817 09/07/18 2312   TROPONINI 0.05* 0.04* 0.04*   BNP (last 3 results) No results for input(s): PROBNP in the last 8760 hours. HbA1C: No results for input(s): HGBA1C in the last 72 hours. CBG: No results for input(s): GLUCAP in the last 168 hours. Lipid Profile: No results for input(s): CHOL, HDL, LDLCALC, TRIG, CHOLHDL, LDLDIRECT in the last 72 hours. Thyroid Function Tests: No results for input(s): TSH, T4TOTAL, FREET4, T3FREE, THYROIDAB in the last 72 hours. Anemia Panel: No results for input(s): VITAMINB12, FOLATE, FERRITIN, TIBC, IRON, RETICCTPCT in the last 72 hours. Urine analysis:    Component Value Date/Time   COLORURINE YELLOW 09/07/2018 1522   APPEARANCEUR CLEAR 09/07/2018 1522   LABSPEC <1.005 (L) 09/07/2018 1522   PHURINE 6.0 09/07/2018 1522   GLUCOSEU NEGATIVE 09/07/2018 1522   HGBUR NEGATIVE 09/07/2018 1522   BILIRUBINUR NEGATIVE 09/07/2018 1522   KETONESUR 40 (A)  09/07/2018 1522   PROTEINUR NEGATIVE 09/07/2018 1522   NITRITE NEGATIVE 09/07/2018 1522   LEUKOCYTESUR TRACE (A) 09/07/2018 1522   Sepsis Labs: @LABRCNTIP (procalcitonin:4,lacticidven:4) )No results found for this or any previous visit (from the past 240 hour(s)).   Radiological Exams on Admission: Dg Chest 2 View  Result Date: 09/07/2018 CLINICAL DATA:  Chest pain and vomiting EXAM: CHEST - 2 VIEW COMPARISON:  None. FINDINGS: There is an air-fluid level above the diaphragm posterior to the right heart. There is also an apparent gastric air bubble on the left. There is atelectatic change in each lung base. The lungs elsewhere are clear. Heart size and pulmonary vascularity normal. No adenopathy. No bone lesions. IMPRESSION: Air-fluid level posterior to the right heart. Question large bulla containing fluid. Atypical lung abscess could present in this manner. An atypical appearing hernia is also a differential consideration. Note that there is a suspected gastric air bubble in the expected location in the left upper quadrant.  Given this unusual appearance in the absence of prior studies to compare, CT of the chest and upper abdomen, ideally with oral contrast, is felt to be advisable to further assess. Mild bibasilar atelectasis. No frank airspace consolidation evident. Heart size normal. Electronically Signed   By: Bretta Bang III M.D.   On: 09/07/2018 13:57   Ct Chest W Contrast  Result Date: 09/07/2018 CLINICAL DATA:  Epigastric pain.  Nausea and vomiting. EXAM: CT CHEST, ABDOMEN, AND PELVIS WITH CONTRAST TECHNIQUE: Multidetector CT imaging of the chest, abdomen and pelvis was performed following the standard protocol during bolus administration of intravenous contrast. CONTRAST:  ISOVUE-300 IOPAMIDOL (ISOVUE-300) INJECTION 61% COMPARISON:  Two-view chest x-ray of the same day. FINDINGS: CT CHEST FINDINGS Cardiovascular: Heart size is normal. Coronary artery calcifications are present. A 3 vessel arch configuration is present. Atherosclerotic calcifications are present at the arch and in the descending aorta without aneurysm. Pulmonary arteries are within normal limits. Mediastinum/Nodes: Subcentimeter paratracheal lymph nodes are present. No significant mediastinal or hilar adenopathy is present. There is no significant axillary adenopathy. Thoracic inlet is normal. There is some inflammatory change of the distal esophagus with wall thickening. Lungs/Pleura: Atelectasis is present at the right base adjacent to the herniated stomach. The lungs are otherwise clear. No focal nodule, mass, or airspace disease is present. There is no significant pleural effusion or pneumothorax. Musculoskeletal: Vertebral body heights alignment are normal. No focal lytic or blastic lesions are present. Mild endplate degenerative changes are noted in the thoracic spine. CT ABDOMEN PELVIS FINDINGS Hepatobiliary: No focal liver abnormality is seen. No gallstones, gallbladder wall thickening, or biliary dilatation. Pancreas: Unremarkable. No  pancreatic ductal dilatation or surrounding inflammatory changes. Spleen: Normal in size without focal abnormality. Adrenals/Urinary Tract: Adrenal glands are unremarkable. Kidneys are normal, without renal calculi, focal lesion, or hydronephrosis. Bladder is unremarkable. Stomach/Bowel: The stomach is massively dilated. The antrum extends superior to the GE junction within a paraesophageal hernia. The duodenum extends superiorly into the hernia is well. Duodenum is normal in caliber. Small bowel is unremarkable. Distal small bowel is collapsed. Diverticular changes are present in the scratched at the ascending and transverse colon are within normal limits. Diverticular changes are present in the distal descending and sigmoid colon without inflammatory change. There is collapse the distal sigmoid colon. Vascular/Lymphatic: Atherosclerotic changes are present within the aorta and branch vessels. There is no aneurysm. Reproductive: Status post hysterectomy. No adnexal masses. Other: No other ventral hernia or free fluid is present. Musculoskeletal: Vertebral body heights alignment  are normal. Degenerative changes are noted at the SI joints bilaterally and in the lower lumbar spine facets. Hips are located and normal. IMPRESSION: 1. Marked dilation of the stomach with extension of the antrum into a paraesophageal hernia suggesting gastric volvulus. The stomach is obstructed. 2. Distal bowel is mostly collapsed. 3. Atelectasis the lung bases bilaterally. 4. Inflammatory changes involving the distal esophagus and proximal duodenum, potentially ischemic. 5.  Aortic Atherosclerosis (ICD10-I70.0). 6. Coronary artery disease These results were called by telephone at the time of interpretation on 09/07/2018 at 5:10 pm to Dr. Theda BelfastHRIS TEGELER , who verbally acknowledged these results. Electronically Signed   By: Marin Robertshristopher  Mattern M.D.   On: 09/07/2018 17:11   Ct Abdomen Pelvis W Contrast  Result Date: 09/07/2018 CLINICAL  DATA:  Epigastric pain.  Nausea and vomiting. EXAM: CT CHEST, ABDOMEN, AND PELVIS WITH CONTRAST TECHNIQUE: Multidetector CT imaging of the chest, abdomen and pelvis was performed following the standard protocol during bolus administration of intravenous contrast. CONTRAST:  100mL ISOVUE-300 IOPAMIDOL (ISOVUE-300) INJECTION 61% COMPARISON:  Two-view chest x-ray of the same day. FINDINGS: CT CHEST FINDINGS Cardiovascular: Heart size is normal. Coronary artery calcifications are present. A 3 vessel arch configuration is present. Atherosclerotic calcifications are present at the arch and in the descending aorta without aneurysm. Pulmonary arteries are within normal limits. Mediastinum/Nodes: Subcentimeter paratracheal lymph nodes are present. No significant mediastinal or hilar adenopathy is present. There is no significant axillary adenopathy. Thoracic inlet is normal. There is some inflammatory change of the distal esophagus with wall thickening. Lungs/Pleura: Atelectasis is present at the right base adjacent to the herniated stomach. The lungs are otherwise clear. No focal nodule, mass, or airspace disease is present. There is no significant pleural effusion or pneumothorax. Musculoskeletal: Vertebral body heights alignment are normal. No focal lytic or blastic lesions are present. Mild endplate degenerative changes are noted in the thoracic spine. CT ABDOMEN PELVIS FINDINGS Hepatobiliary: No focal liver abnormality is seen. No gallstones, gallbladder wall thickening, or biliary dilatation. Pancreas: Unremarkable. No pancreatic ductal dilatation or surrounding inflammatory changes. Spleen: Normal in size without focal abnormality. Adrenals/Urinary Tract: Adrenal glands are unremarkable. Kidneys are normal, without renal calculi, focal lesion, or hydronephrosis. Bladder is unremarkable. Stomach/Bowel: The stomach is massively dilated. The antrum extends superior to the GE junction within a paraesophageal hernia. The  duodenum extends superiorly into the hernia is well. Duodenum is normal in caliber. Small bowel is unremarkable. Distal small bowel is collapsed. Diverticular changes are present in the scratched at the ascending and transverse colon are within normal limits. Diverticular changes are present in the distal descending and sigmoid colon without inflammatory change. There is collapse the distal sigmoid colon. Vascular/Lymphatic: Atherosclerotic changes are present within the aorta and branch vessels. There is no aneurysm. Reproductive: Status post hysterectomy. No adnexal masses. Other: No other ventral hernia or free fluid is present. Musculoskeletal: Vertebral body heights alignment are normal. Degenerative changes are noted at the SI joints bilaterally and in the lower lumbar spine facets. Hips are located and normal. IMPRESSION: 1. Marked dilation of the stomach with extension of the antrum into a paraesophageal hernia suggesting gastric volvulus. The stomach is obstructed. 2. Distal bowel is mostly collapsed. 3. Atelectasis the lung bases bilaterally. 4. Inflammatory changes involving the distal esophagus and proximal duodenum, potentially ischemic. 5.  Aortic Atherosclerosis (ICD10-I70.0). 6. Coronary artery disease These results were called by telephone at the time of interpretation on 09/07/2018 at 5:10 pm to Dr. Theda BelfastHRIS TEGELER , who verbally acknowledged  these results. Electronically Signed   By: Marin Roberts M.D.   On: 09/07/2018 17:11   Dg Abd Portable 1v  Result Date: 09/08/2018 CLINICAL DATA:  73 year old female NG tube placement. EXAM: PORTABLE ABDOMEN - 1 VIEW COMPARISON:  CT chest abdomen and pelvis earlier today. FINDINGS: Portable AP supine view at 2325 hours. Enteric tube is looped in the gas dilated stomach, side hole at the level of the gastric body (arrow). Stable and nonobstructed bowel-gas pattern elsewhere. Lung bases appear stable. IMPRESSION: Successful NG tube placement into the  stomach, side hole at the level of the gastric body. Electronically Signed   By: Odessa Fleming M.D.   On: 09/08/2018 00:00     EKG: Independently reviewed.  Sinus rhythm, right atrial enlargement, nonspecific T wave change.  Assessment/Plan Principal Problem:   Abdominal pain Active Problems:   Gastric volvulus   Asthma   Hypothyroidism   Elevated troponin   Alcohol use   Abdominal pain and possible gastric volvulus: CT scan showed possible gastric volvulus.  General surgeon, Dr. Wylene Men was consulted.  Per Dr. Fredricka Bonine, patient likely to have gastric outlet obstruction secondary to hiatal hernia.  She recommended NG tube.  Currently patient is hemodynamically stable.  No signs of perforation.  -will admitted to MedSurg bed as inpatient. -Highly appreciated Dr. Eustaquio Boyden consultation -NG tube -IV fluid: 1 L Ringer's solution, then 100 cc/h -Continue Protonix 40 mg twice daily by IV -Patient is allergic to morphine, she would like to hold off narcotic.   -She preferred PRN Tylenol for pain.   Ashma: stable.  -prn albuterol nebs  Hypothyroidism:  -switched oral Synthroid to IV, cut dose by half from 100 to 50 mcg daily  Alcohol use: -CiWA protocol  Elevated troponin:Trop 0.05-->0.04. no CP or SOB.  Unclear etiology, likely nonspecific. -Check A1c, FLP, troponin x3 -Repeat EKG morning. -As needed nitroglycerin if develops chest pain.    Inpatient status:  # Patient requires inpatient status due to high intensity of service, high risk for further deterioration and high frequency of surveillance required.  I certify that at the point of admission it is my clinical judgment that the patient will require inpatient hospital care spanning beyond 2 midnights from the point of admission.  . This patient has multiple chronic comorbidities including alcohol use, asthma, GERD, hypothyroidism, who presents with nausea, vomiting and abdominal pain. .  . Now patient has presenting with nausea  vomiting, abdominal pain, possibly due to gastric outlet obstruction secondary to large hiatal hernia. . The worrisome physical exam findings include abdominal tenderness. . The initial radiographic and laboratory data are worrisome because of abnormal CT findings of possible gastric volvulus, and gastric outlet obstruction secondary to large hiatal hernia. . Current medical needs: please see my assessment and plan . Predictability of an adverse outcome (risk): Patient has possible gastric volvulus and gastric outlet obstruction secondary to large hiatal hernia.  Currently being treated with supportive care and NG tube.  If getting worse, patient may need surgery.  Patient will need to be treated in hospital for at least 2 days.        DVT ppx: SCD Code Status: Full code Family Communication:   Yes, patient's son and the daughter-in-law at bed side Disposition Plan:  Anticipate discharge back to previous home environment Consults called:  Dr. Fredricka Bonine of general surgeon Admission status: Inpatient/tele   Date of Service 09/08/2018    Lorretta Harp [facility]   If 7PM-7AM, please contact night-coverage [email or web address] Password  [email or web address] 09/08/2018, 5:26 AM

## 2018-09-08 NOTE — Progress Notes (Signed)
Patient Demographics:    Elizabeth Banks, is a 73 y.o. female, DOB - [birth year], DHR:[id number]  Admit date - 09/07/2018   Admitting Physician Lorretta Harp, MD  Outpatient Primary MD for the patient is Patient, No Pcp Per  LOS - 1   Chief Complaint  Patient presents with  . Abdominal Pain        Subjective:    Elizabeth Banks today has no fevers, no further emesis,  No chest pain, bedside, questions answered, feels better after NG tube placement,   Assessment  & Plan :    Principal Problem:   Abdominal pain Active Problems:   Gastric volvulus   Asthma   Hypothyroidism   Elevated troponin   Alcohol use  Brief Summary 73 yo female with recurrent nausea/vomting found to have large paraesophageal hernia with majority of antrum and first portion of duodenum in the chest and obstructive symptoms.  Admitted to 11/22/2018,  NG placed  with >1l output and symptomatic improvement. plan for laparoscopic repair and gastrostomy tube insertion probably 09/10/2018   Plan:- 1)Possible Gastric Volvulus--- surgical consult appreciated, continue NG tube to intermittent wall suction, plan for laparoscopic repair and G-tube placement on 09/10/2018, continue Protonix, continue Zofran PRN,   2)H/o ETOH Abuse--- continue lorazepam per CIWA protocol, continue folic acid 1 mg daily, continue thiamine 100 mg daily  3)Hypothyroidism--- stable, at home patient was on 100 mcg levothyroxine, okay to give IV levothyroxine 50 mcg daily  4)H/o Asthma--- stable, no flareup continue PRN bronchodilators  Disposition/Need for in-Hospital Stay- patient unable to be discharged at this time due to inability to tolerate oral intake, patient continues to require NG tube suction and IV fluids pending surgical intervention  Code Status : Full  Family Communication:   Son at bedside   Disposition Plan  : Home postoperatively  Consults  :   Gen surgery  DVT Prophylaxis  :    - SCDs   Lab Results  Component Value Date   PLT 216 09/08/2018    Inpatient Medications  Scheduled Meds: . fluticasone furoate-vilanterol  1 puff Inhalation Daily  . folic acid  1 mg Oral Daily  . levothyroxine  50 mcg Intravenous Daily  . LORazepam  0-4 mg Intravenous Q6H   Followed by  . [START ON 09/09/2018] LORazepam  0-4 mg Intravenous Q12H  .  morphine injection  4 mg Intravenous Once  . multivitamin with minerals  1 tablet Oral Daily  . pantoprazole  40 mg Intravenous Q12H  . thiamine  100 mg Oral Daily   Or  . thiamine  100 mg Intravenous Daily   Continuous Infusions: . sodium chloride 100 mL/hr at 09/08/18 1500   PRN Meds:.acetaminophen, albuterol, LORazepam **OR** LORazepam, nitroGLYCERIN, ondansetron (ZOFRAN) IV    Anti-infectives (From admission, onward)   None        Objective:   Vitals:   09/08/18 0529 09/08/18 0625 09/08/18 0847 09/08/18 1329  BP: (!) 160/73   (!) 137/59  Pulse: 63   61  Resp: 12 16  16   Temp: 97.7 F (36.5 C)   98.3 F (36.8 C)  TempSrc: Oral   Oral  SpO2: 99%  92% 96%  Weight:      Height:  Wt Readings from Last 3 Encounters:  09/07/18 57.2 kg     Intake/Output Summary (Last 24 hours) at 09/08/2018 1620 Last data filed at 09/08/2018 1500 Gross per 24 hour  Intake 2514.16 ml  Output 2000 ml  Net 514.16 ml     Physical Exam Patient is examined daily including today on 09/08/18 , exams remain the same as of yesterday except that has changed   Gen:- Awake Alert,  In no apparent distress  HEENT:- [place].AT, No sclera icterus Nose- NG tube with gastric contents Neck-Supple Neck,No JVD,.  Lungs-  CTAB , fair symmetrical air movement CV- S1, S2 normal, regular  Abd-  +ve B.Sounds, less tender, soft, bowel sounds are diminished  extremity/Skin:- No  edema, pedal pulses present  Psych-affect is appropriate, oriented x3 Neuro-no new focal deficits, no tremors   Data Review:   Micro  Results No results found for this or any previous visit (from the past 240 hour(s)).  Radiology Reports Dg Chest 2 View  Result Date: 09/07/2018 CLINICAL DATA:  Chest pain and vomiting EXAM: CHEST - 2 VIEW COMPARISON:  None. FINDINGS: There is an air-fluid level above the diaphragm posterior to the right heart. There is also an apparent gastric air bubble on the left. There is atelectatic change in each lung base. The lungs elsewhere are clear. Heart size and pulmonary vascularity normal. No adenopathy. No bone lesions. IMPRESSION: Air-fluid level posterior to the right heart. Question large bulla containing fluid. Atypical lung abscess could present in this manner. An atypical appearing hernia is also a differential consideration. Note that there is a suspected gastric air bubble in the expected location in the left upper quadrant. Given this unusual appearance in the absence of prior studies to compare, CT of the chest and upper abdomen, ideally with oral contrast, is felt to be advisable to further assess. Mild bibasilar atelectasis. No frank airspace consolidation evident. Heart size normal. Electronically Signed   By: Bretta Bang III M.D.   On: 09/07/2018 13:57   Ct Chest W Contrast  Result Date: 09/07/2018 CLINICAL DATA:  Epigastric pain.  Nausea and vomiting. EXAM: CT CHEST, ABDOMEN, AND PELVIS WITH CONTRAST TECHNIQUE: Multidetector CT imaging of the chest, abdomen and pelvis was performed following the standard protocol during bolus administration of intravenous contrast. CONTRAST:  ISOVUE-300 IOPAMIDOL (ISOVUE-300) INJECTION 61% COMPARISON:  Two-view chest x-ray of the same day. FINDINGS: CT CHEST FINDINGS Cardiovascular: Heart size is normal. Coronary artery calcifications are present. A 3 vessel arch configuration is present. Atherosclerotic calcifications are present at the arch and in the descending aorta without aneurysm. Pulmonary arteries are within normal limits.  Mediastinum/Nodes: Subcentimeter paratracheal lymph nodes are present. No significant mediastinal or hilar adenopathy is present. There is no significant axillary adenopathy. Thoracic inlet is normal. There is some inflammatory change of the distal esophagus with wall thickening. Lungs/Pleura: Atelectasis is present at the right base adjacent to the herniated stomach. The lungs are otherwise clear. No focal nodule, mass, or airspace disease is present. There is no significant pleural effusion or pneumothorax. Musculoskeletal: Vertebral body heights alignment are normal. No focal lytic or blastic lesions are present. Mild endplate degenerative changes are noted in the thoracic spine. CT ABDOMEN PELVIS FINDINGS Hepatobiliary: No focal liver abnormality is seen. No gallstones, gallbladder wall thickening, or biliary dilatation. Pancreas: Unremarkable. No pancreatic ductal dilatation or surrounding inflammatory changes. Spleen: Normal in size without focal abnormality. Adrenals/Urinary Tract: Adrenal glands are unremarkable. Kidneys are normal, without renal calculi, focal lesion, or  hydronephrosis. Bladder is unremarkable. Stomach/Bowel: The stomach is massively dilated. The antrum extends superior to the GE junction within a paraesophageal hernia. The duodenum extends superiorly into the hernia is well. Duodenum is normal in caliber. Small bowel is unremarkable. Distal small bowel is collapsed. Diverticular changes are present in the scratched at the ascending and transverse colon are within normal limits. Diverticular changes are present in the distal descending and sigmoid colon without inflammatory change. There is collapse the distal sigmoid colon. Vascular/Lymphatic: Atherosclerotic changes are present within the aorta and branch vessels. There is no aneurysm. Reproductive: Status post hysterectomy. No adnexal masses. Other: No other ventral hernia or free fluid is present. Musculoskeletal: Vertebral body heights  alignment are normal. Degenerative changes are noted at the SI joints bilaterally and in the lower lumbar spine facets. Hips are located and normal. IMPRESSION: 1. Marked dilation of the stomach with extension of the antrum into a paraesophageal hernia suggesting gastric volvulus. The stomach is obstructed. 2. Distal bowel is mostly collapsed. 3. Atelectasis the lung bases bilaterally. 4. Inflammatory changes involving the distal esophagus and proximal duodenum, potentially ischemic. 5.  Aortic Atherosclerosis (ICD10-I70.0). 6. Coronary artery disease These results were called by telephone at the time of interpretation on 09/07/2018 at 5:10 pm to Dr. Theda BelfastHRIS TEGELER , who verbally acknowledged these results. Electronically Signed   By: Marin Robertshristopher  Mattern M.D.   On: 09/07/2018 17:11   Ct Abdomen Pelvis W Contrast  Result Date: 09/07/2018 CLINICAL DATA:  Epigastric pain.  Nausea and vomiting. EXAM: CT CHEST, ABDOMEN, AND PELVIS WITH CONTRAST TECHNIQUE: Multidetector CT imaging of the chest, abdomen and pelvis was performed following the standard protocol during bolus administration of intravenous contrast. CONTRAST:  100mL ISOVUE-300 IOPAMIDOL (ISOVUE-300) INJECTION 61% COMPARISON:  Two-view chest x-ray of the same day. FINDINGS: CT CHEST FINDINGS Cardiovascular: Heart size is normal. Coronary artery calcifications are present. A 3 vessel arch configuration is present. Atherosclerotic calcifications are present at the arch and in the descending aorta without aneurysm. Pulmonary arteries are within normal limits. Mediastinum/Nodes: Subcentimeter paratracheal lymph nodes are present. No significant mediastinal or hilar adenopathy is present. There is no significant axillary adenopathy. Thoracic inlet is normal. There is some inflammatory change of the distal esophagus with wall thickening. Lungs/Pleura: Atelectasis is present at the right base adjacent to the herniated stomach. The lungs are otherwise clear. No focal  nodule, mass, or airspace disease is present. There is no significant pleural effusion or pneumothorax. Musculoskeletal: Vertebral body heights alignment are normal. No focal lytic or blastic lesions are present. Mild endplate degenerative changes are noted in the thoracic spine. CT ABDOMEN PELVIS FINDINGS Hepatobiliary: No focal liver abnormality is seen. No gallstones, gallbladder wall thickening, or biliary dilatation. Pancreas: Unremarkable. No pancreatic ductal dilatation or surrounding inflammatory changes. Spleen: Normal in size without focal abnormality. Adrenals/Urinary Tract: Adrenal glands are unremarkable. Kidneys are normal, without renal calculi, focal lesion, or hydronephrosis. Bladder is unremarkable. Stomach/Bowel: The stomach is massively dilated. The antrum extends superior to the GE junction within a paraesophageal hernia. The duodenum extends superiorly into the hernia is well. Duodenum is normal in caliber. Small bowel is unremarkable. Distal small bowel is collapsed. Diverticular changes are present in the scratched at the ascending and transverse colon are within normal limits. Diverticular changes are present in the distal descending and sigmoid colon without inflammatory change. There is collapse the distal sigmoid colon. Vascular/Lymphatic: Atherosclerotic changes are present within the aorta and branch vessels. There is no aneurysm. Reproductive: Status post hysterectomy. No  adnexal masses. Other: No other ventral hernia or free fluid is present. Musculoskeletal: Vertebral body heights alignment are normal. Degenerative changes are noted at the SI joints bilaterally and in the lower lumbar spine facets. Hips are located and normal. IMPRESSION: 1. Marked dilation of the stomach with extension of the antrum into a paraesophageal hernia suggesting gastric volvulus. The stomach is obstructed. 2. Distal bowel is mostly collapsed. 3. Atelectasis the lung bases bilaterally. 4. Inflammatory  changes involving the distal esophagus and proximal duodenum, potentially ischemic. 5.  Aortic Atherosclerosis (ICD10-I70.0). 6. Coronary artery disease These results were called by telephone at the time of interpretation on 09/07/2018 at 5:10 pm to Dr. Theda Belfast , who verbally acknowledged these results. Electronically Signed   By: Marin Roberts M.D.   On: 09/07/2018 17:11   Dg Abd Portable 1v  Result Date: 09/08/2018 CLINICAL DATA:  73 year old female NG tube placement. EXAM: PORTABLE ABDOMEN - 1 VIEW COMPARISON:  CT chest abdomen and pelvis earlier today. FINDINGS: Portable AP supine view at 2325 hours. Enteric tube is looped in the gas dilated stomach, side hole at the level of the gastric body (arrow). Stable and nonobstructed bowel-gas pattern elsewhere. Lung bases appear stable. IMPRESSION: Successful NG tube placement into the stomach, side hole at the level of the gastric body. Electronically Signed   By: Odessa Fleming M.D.   On: 09/08/2018 00:00     CBC Recent Labs  Lab 09/07/18 1515 09/08/18 0656  WBC 7.1 5.7  HGB 14.1 12.0  HCT 42.6 36.3  PLT 270 216  MCV 93.4 92.8  MCH 30.9 30.7  MCHC 33.1 33.1  RDW 13.2 13.0    Chemistries  Recent Labs  Lab 09/07/18 1515 09/08/18 0656  NA 131* 135  K 4.0 3.2*  CL 94* 101  CO2 27 23  GLUCOSE 88 69*  BUN 20 11  CREATININE 0.66 0.77  CALCIUM 8.5* 7.8*  AST 22 19  ALT 13 14  ALKPHOS 50 35*  BILITOT 2.1* 1.7*   ------------------------------------------------------------------------------------------------------------------ Recent Labs    09/08/18 0656  CHOL 208*  HDL 52  LDLCALC 143*  TRIG 63  CHOLHDL 4.0    Lab Results  Component Value Date   HGBA1C 5.8 (H) 09/08/2018   ------------------------------------------------------------------------------------------------------------------ No results for input(s): TSH, T4TOTAL, T3FREE, THYROIDAB in the last 72 hours.  Invalid input(s):  FREET3 ------------------------------------------------------------------------------------------------------------------ No results for input(s): VITAMINB12, FOLATE, FERRITIN, TIBC, IRON, RETICCTPCT in the last 72 hours.  Coagulation profile Recent Labs  Lab 09/07/18 2312  INR 1.02    No results for input(s): DDIMER in the last 72 hours.  Cardiac Enzymes Recent Labs  Lab 09/07/18 2312 09/08/18 0656 09/08/18 1036  TROPONINI 0.04* 0.03* 0.03*   ------------------------------------------------------------------------------------------------------------------ No results found for: BNP   Shon Hale M.D on 09/08/2018 at 4:20 PM  Go to [email or web address] - for contact info  [facility] - Office  [phone]

## 2018-09-08 NOTE — Progress Notes (Signed)
Troponin level still at 0.04, patient not in distress, VSS, telemtry and continuous oximeter monitors on and showing normal parameters, patient sleeping soundly.  On-call [facility] coverage was made aware thru text page. Will monitor.

## 2018-09-09 LAB — BASIC METABOLIC PANEL
Anion gap: 11 (ref 5–15)
BUN: 8 mg/dL (ref 8–23)
CO2: 24 mmol/L (ref 22–32)
Calcium: 8 mg/dL — ABNORMAL LOW (ref 8.9–10.3)
Chloride: 101 mmol/L (ref 98–111)
Creatinine, Ser: 0.71 mg/dL (ref 0.44–1.00)
GFR calc Af Amer: >60 mL/min (ref >60)
GFR calc non Af Amer: >60 mL/min (ref >60)
Glucose, Bld: 97 mg/dL (ref 70–99)
Potassium: 3.3 mmol/L — ABNORMAL LOW (ref 3.5–5.1)
Sodium: 136 mmol/L (ref 135–145)

## 2018-09-09 LAB — GLUCOSE, CAPILLARY
Glucose-Capillary: 104 mg/dL — ABNORMAL HIGH (ref 70–99)
Glucose-Capillary: 121 mg/dL — ABNORMAL HIGH (ref 70–99)
Glucose-Capillary: 79 mg/dL (ref 70–99)
Glucose-Capillary: 93 mg/dL (ref 70–99)

## 2018-09-09 LAB — SURGICAL PCR SCREEN
MRSA, PCR: NEGATIVE
Staphylococcus aureus: NEGATIVE

## 2018-09-09 LAB — MAGNESIUM: Magnesium: 1.8 mg/dL (ref 1.7–2.4)

## 2018-09-09 MED ORDER — PHENOL 1.4 % MT LIQD
1.0000 | OROMUCOSAL | Status: DC | PRN
  Administered 2018-09-09: 1 via OROMUCOSAL
  Filled 2018-09-09: qty 177

## 2018-09-09 MED ORDER — POTASSIUM CHLORIDE 10 MEQ/100ML IV SOLN
10.0000 meq | INTRAVENOUS | Status: AC
  Administered 2018-09-09 (×6): 10 meq via INTRAVENOUS
  Filled 2018-09-09 (×5): qty 100

## 2018-09-09 MED ORDER — CHLORHEXIDINE GLUCONATE CLOTH 2 % EX PADS
6.0000 | MEDICATED_PAD | Freq: Once | CUTANEOUS | Status: AC
  Administered 2018-09-10: 6 via TOPICAL

## 2018-09-09 MED ORDER — GABAPENTIN 300 MG PO CAPS
300.0000 mg | ORAL_CAPSULE | ORAL | Status: AC
  Administered 2018-09-10: 300 mg via ORAL
  Filled 2018-09-09: qty 1

## 2018-09-09 MED ORDER — ACETAMINOPHEN 500 MG PO TABS
1000.0000 mg | ORAL_TABLET | ORAL | Status: AC
  Administered 2018-09-10: 1000 mg via ORAL
  Filled 2018-09-09: qty 2

## 2018-09-09 MED ORDER — METRONIDAZOLE IN NACL 5-0.79 MG/ML-% IV SOLN
500.0000 mg | INTRAVENOUS | Status: AC
  Administered 2018-09-10: 500 mg via INTRAVENOUS
  Filled 2018-09-09: qty 100

## 2018-09-09 MED ORDER — SCOPOLAMINE 1 MG/3DAYS TD PT72
1.0000 | MEDICATED_PATCH | TRANSDERMAL | Status: DC
  Administered 2018-09-10: 1.5 mg via TRANSDERMAL
  Filled 2018-09-09: qty 1

## 2018-09-09 MED ORDER — CHLORHEXIDINE GLUCONATE CLOTH 2 % EX PADS
6.0000 | MEDICATED_PAD | Freq: Once | CUTANEOUS | Status: AC
  Administered 2018-09-09: 6 via TOPICAL

## 2018-09-09 MED ORDER — CIPROFLOXACIN IN D5W 400 MG/200ML IV SOLN
400.0000 mg | INTRAVENOUS | Status: AC
  Administered 2018-09-10: 400 mg via INTRAVENOUS
  Filled 2018-09-09: qty 200

## 2018-09-09 NOTE — Progress Notes (Signed)
Patient is very upset over her husband passing away this morning at home with her son.  She is to have surgery tomorrow.  They are comforting one another.  Son wants to talk to surgeon due to this death happening.  Mother and son are comforting one another right now.  Page chaplain if needed. Phebe Colla, Chaplain   09/09/18 1400  Clinical Encounter Type  Visited With Patient;Family  Visit Type Initial;Spiritual support;Death;Other (Comment) (patients' husband died early today. Son is with her.)  Referral From Nurse  Consult/Referral To Chaplain  Spiritual Encounters  Spiritual Needs Grief support;Other (Comment) (they are in shock and need some time and space right now.  )  Stress Factors  Patient Stress Factors Loss;Other (Comment) (trying to be ready for sugery tomorrow-very upset)  Family Stress Factors  (Son concerned-because of this death-scared for his mother)

## 2018-09-09 NOTE — Progress Notes (Signed)
Patient ID: Elizabeth Banks, female   DOB: 08/14/1945, 73 y.o.   MRN: [id number]       Subjective: CC: Abdominal Pain Reports pain and nausea resolved. No flatus or BM. Mobilizing.  Objective: Vital signs in last 24 hours: Temp:  [97.9 F (36.6 C)-98.3 F (36.8 C)] 98.1 F (36.7 C) (02/06 0459) Pulse Rate:  [54-65] 65 (02/06 0850) Resp:  [16] 16 (02/06 0850) BP: (137-185)/(59-80) 177/80 (02/06 0459) SpO2:  [96 %-98 %] 98 % (02/06 0850) Last BM Date: 09/07/18  Intake/Output from previous day: 02/05 0701 - 02/06 0700 In: 2606.9 [I.V.:2606.9] Out: 2100 [Urine:2100] Intake/Output this shift: No intake/output data recorded.  PE: Gen: Awake and alert Heart: RRR Lungs: CTA b/l Abd: Soft, NT, ND. Hypoactive BS Ext: no edema  Lab Results:  Recent Labs    09/07/18 1515 09/08/18 0656  WBC 7.1 5.7  HGB 14.1 12.0  HCT 42.6 36.3  PLT 270 216   BMET Recent Labs    09/08/18 0656 09/09/18 0307  NA 135 136  K 3.2* 3.3*  CL 101 101  CO2 23 24  GLUCOSE 69* 97  BUN 11 8  CREATININE 0.77 0.71  CALCIUM 7.8* 8.0*   PT/INR Recent Labs    09/07/18 2312  LABPROT 13.3  INR 1.02   CMP     Component Value Date/Time   NA 136 09/09/2018 0307   K 3.3 (L) 09/09/2018 0307   CL 101 09/09/2018 0307   CO2 24 09/09/2018 0307   GLUCOSE 97 09/09/2018 0307   BUN 8 09/09/2018 0307   CREATININE 0.71 09/09/2018 0307   CALCIUM 8.0 (L) 09/09/2018 0307   PROT 5.3 (L) 09/08/2018 0656   ALBUMIN 2.8 (L) 09/08/2018 0656   AST 19 09/08/2018 0656   ALT 14 09/08/2018 0656   ALKPHOS 35 (L) 09/08/2018 0656   BILITOT 1.7 (H) 09/08/2018 0656   GFRNONAA >60 09/09/2018 0307   GFRAA >60 09/09/2018 0307   Lipase     Component Value Date/Time   LIPASE 191 (H) 09/07/2018 1515       Studies/Results: Dg Chest 2 View  Result Date: 09/07/2018 CLINICAL DATA:  Chest pain and vomiting EXAM: CHEST - 2 VIEW COMPARISON:  None. FINDINGS: There is an air-fluid level above the diaphragm posterior to  the right heart. There is also an apparent gastric air bubble on the left. There is atelectatic change in each lung base. The lungs elsewhere are clear. Heart size and pulmonary vascularity normal. No adenopathy. No bone lesions. IMPRESSION: Air-fluid level posterior to the right heart. Question large bulla containing fluid. Atypical lung abscess could present in this manner. An atypical appearing hernia is also a differential consideration. Note that there is a suspected gastric air bubble in the expected location in the left upper quadrant. Given this unusual appearance in the absence of prior studies to compare, CT of the chest and upper abdomen, ideally with oral contrast, is felt to be advisable to further assess. Mild bibasilar atelectasis. No frank airspace consolidation evident. Heart size normal. Electronically Signed   By: Bretta BangWilliam  Woodruff III M.D.   On: 09/07/2018 13:57   Ct Chest W Contrast  Result Date: 09/07/2018 CLINICAL DATA:  Epigastric pain.  Nausea and vomiting. EXAM: CT CHEST, ABDOMEN, AND PELVIS WITH CONTRAST TECHNIQUE: Multidetector CT imaging of the chest, abdomen and pelvis was performed following the standard protocol during bolus administration of intravenous contrast. CONTRAST:  100mL ISOVUE-300 IOPAMIDOL (ISOVUE-300) INJECTION 61% COMPARISON:  Two-view chest x-ray of the same  day. FINDINGS: CT CHEST FINDINGS Cardiovascular: Heart size is normal. Coronary artery calcifications are present. A 3 vessel arch configuration is present. Atherosclerotic calcifications are present at the arch and in the descending aorta without aneurysm. Pulmonary arteries are within normal limits. Mediastinum/Nodes: Subcentimeter paratracheal lymph nodes are present. No significant mediastinal or hilar adenopathy is present. There is no significant axillary adenopathy. Thoracic inlet is normal. There is some inflammatory change of the distal esophagus with wall thickening. Lungs/Pleura: Atelectasis is present  at the right base adjacent to the herniated stomach. The lungs are otherwise clear. No focal nodule, mass, or airspace disease is present. There is no significant pleural effusion or pneumothorax. Musculoskeletal: Vertebral body heights alignment are normal. No focal lytic or blastic lesions are present. Mild endplate degenerative changes are noted in the thoracic spine. CT ABDOMEN PELVIS FINDINGS Hepatobiliary: No focal liver abnormality is seen. No gallstones, gallbladder wall thickening, or biliary dilatation. Pancreas: Unremarkable. No pancreatic ductal dilatation or surrounding inflammatory changes. Spleen: Normal in size without focal abnormality. Adrenals/Urinary Tract: Adrenal glands are unremarkable. Kidneys are normal, without renal calculi, focal lesion, or hydronephrosis. Bladder is unremarkable. Stomach/Bowel: The stomach is massively dilated. The antrum extends superior to the GE junction within a paraesophageal hernia. The duodenum extends superiorly into the hernia is well. Duodenum is normal in caliber. Small bowel is unremarkable. Distal small bowel is collapsed. Diverticular changes are present in the scratched at the ascending and transverse colon are within normal limits. Diverticular changes are present in the distal descending and sigmoid colon without inflammatory change. There is collapse the distal sigmoid colon. Vascular/Lymphatic: Atherosclerotic changes are present within the aorta and branch vessels. There is no aneurysm. Reproductive: Status post hysterectomy. No adnexal masses. Other: No other ventral hernia or free fluid is present. Musculoskeletal: Vertebral body heights alignment are normal. Degenerative changes are noted at the SI joints bilaterally and in the lower lumbar spine facets. Hips are located and normal. IMPRESSION: 1. Marked dilation of the stomach with extension of the antrum into a paraesophageal hernia suggesting gastric volvulus. The stomach is obstructed. 2.  Distal bowel is mostly collapsed. 3. Atelectasis the lung bases bilaterally. 4. Inflammatory changes involving the distal esophagus and proximal duodenum, potentially ischemic. 5.  Aortic Atherosclerosis (ICD10-I70.0). 6. Coronary artery disease These results were called by telephone at the time of interpretation on 09/07/2018 at 5:10 pm to Dr. Theda Belfast , who verbally acknowledged these results. Electronically Signed   By: Marin Roberts M.D.   On: 09/07/2018 17:11   Ct Abdomen Pelvis W Contrast  Result Date: 09/07/2018 CLINICAL DATA:  Epigastric pain.  Nausea and vomiting. EXAM: CT CHEST, ABDOMEN, AND PELVIS WITH CONTRAST TECHNIQUE: Multidetector CT imaging of the chest, abdomen and pelvis was performed following the standard protocol during bolus administration of intravenous contrast. CONTRAST:  ISOVUE-300 IOPAMIDOL (ISOVUE-300) INJECTION 61% COMPARISON:  Two-view chest x-ray of the same day. FINDINGS: CT CHEST FINDINGS Cardiovascular: Heart size is normal. Coronary artery calcifications are present. A 3 vessel arch configuration is present. Atherosclerotic calcifications are present at the arch and in the descending aorta without aneurysm. Pulmonary arteries are within normal limits. Mediastinum/Nodes: Subcentimeter paratracheal lymph nodes are present. No significant mediastinal or hilar adenopathy is present. There is no significant axillary adenopathy. Thoracic inlet is normal. There is some inflammatory change of the distal esophagus with wall thickening. Lungs/Pleura: Atelectasis is present at the right base adjacent to the herniated stomach. The lungs are otherwise clear. No focal nodule, mass, or airspace  disease is present. There is no significant pleural effusion or pneumothorax. Musculoskeletal: Vertebral body heights alignment are normal. No focal lytic or blastic lesions are present. Mild endplate degenerative changes are noted in the thoracic spine. CT ABDOMEN PELVIS FINDINGS  Hepatobiliary: No focal liver abnormality is seen. No gallstones, gallbladder wall thickening, or biliary dilatation. Pancreas: Unremarkable. No pancreatic ductal dilatation or surrounding inflammatory changes. Spleen: Normal in size without focal abnormality. Adrenals/Urinary Tract: Adrenal glands are unremarkable. Kidneys are normal, without renal calculi, focal lesion, or hydronephrosis. Bladder is unremarkable. Stomach/Bowel: The stomach is massively dilated. The antrum extends superior to the GE junction within a paraesophageal hernia. The duodenum extends superiorly into the hernia is well. Duodenum is normal in caliber. Small bowel is unremarkable. Distal small bowel is collapsed. Diverticular changes are present in the scratched at the ascending and transverse colon are within normal limits. Diverticular changes are present in the distal descending and sigmoid colon without inflammatory change. There is collapse the distal sigmoid colon. Vascular/Lymphatic: Atherosclerotic changes are present within the aorta and branch vessels. There is no aneurysm. Reproductive: Status post hysterectomy. No adnexal masses. Other: No other ventral hernia or free fluid is present. Musculoskeletal: Vertebral body heights alignment are normal. Degenerative changes are noted at the SI joints bilaterally and in the lower lumbar spine facets. Hips are located and normal. IMPRESSION: 1. Marked dilation of the stomach with extension of the antrum into a paraesophageal hernia suggesting gastric volvulus. The stomach is obstructed. 2. Distal bowel is mostly collapsed. 3. Atelectasis the lung bases bilaterally. 4. Inflammatory changes involving the distal esophagus and proximal duodenum, potentially ischemic. 5.  Aortic Atherosclerosis (ICD10-I70.0). 6. Coronary artery disease These results were called by telephone at the time of interpretation on 09/07/2018 at 5:10 pm to Dr. Theda BelfastHRIS TEGELER , who verbally acknowledged these results.  Electronically Signed   By: Marin Robertshristopher  Mattern M.D.   On: 09/07/2018 17:11   Dg Abd Portable 1v  Result Date: 09/08/2018 CLINICAL DATA:  73 year old female NG tube placement. EXAM: PORTABLE ABDOMEN - 1 VIEW COMPARISON:  CT chest abdomen and pelvis earlier today. FINDINGS: Portable AP supine view at 2325 hours. Enteric tube is looped in the gas dilated stomach, side hole at the level of the gastric body (arrow). Stable and nonobstructed bowel-gas pattern elsewhere. Lung bases appear stable. IMPRESSION: Successful NG tube placement into the stomach, side hole at the level of the gastric body. Electronically Signed   By: Odessa FlemingH  Hall M.D.   On: 09/08/2018 00:00    Anti-infectives: Anti-infectives (From admission, onward)   None       Assessment/Plan Hx of Etoh use Hypothyroidism Asthma  Gastric outlet obstruction secondary to hiatal hernia - Maintain LIWS on NG tube - Plan for laparoscopic repair and gastrostomy tube placement on 09/10/2018 - Mobilize and IS  FEN - NPO VTE - SCDs, hold Lovenox till after surgery ID - Periop   LOS: 2 days    Jacinto HalimMichael M Elaynah [place] , [facility] 09/09/2018, 9:18 AM Pager: [phone]

## 2018-09-09 NOTE — Anesthesia Preprocedure Evaluation (Addendum)
Anesthesia Evaluation  Patient identified by MRN, date of birth, ID band Patient awake    Reviewed: Allergy & Precautions, NPO status , Patient's Chart, lab work & pertinent test results  History of Anesthesia Complications Negative for: history of anesthetic complications  Airway Mallampati: II  TM Distance: >3 FB Neck ROM: Full    Dental no notable dental hx. (+) Dental Advisory Given   Pulmonary neg pulmonary ROS,    Pulmonary exam normal        Cardiovascular negative cardio ROS Normal cardiovascular exam     Neuro/Psych negative neurological ROS  negative psych ROS   GI/Hepatic Neg liver ROS,   Endo/Other  Hypothyroidism   Renal/GU negative Renal ROS     Musculoskeletal negative musculoskeletal ROS (+)   Abdominal   Peds  Hematology negative hematology ROS (+)   Anesthesia Other Findings Day of surgery medications reviewed with the patient.  Reproductive/Obstetrics                            Anesthesia Physical Anesthesia Plan  ASA: III  Anesthesia Plan: General   Post-op Pain Management:    Induction: Intravenous  PONV Risk Score and Plan: Ondansetron, Dexamethasone, Scopolamine patch - Pre-op and Diphenhydramine  Airway Management Planned: Oral ETT  Additional Equipment:   Intra-op Plan:   Post-operative Plan: Extubation in OR  Informed Consent: I have reviewed the patients History and Physical, chart, labs and discussed the procedure including the risks, benefits and alternatives for the proposed anesthesia with the patient or authorized representative who has indicated his/her understanding and acceptance.     Dental advisory given  Plan Discussed with: CRNA and Anesthesiologist  Anesthesia Plan Comments:        Anesthesia Quick Evaluation

## 2018-09-09 NOTE — Progress Notes (Signed)
Patient Demographics:    Elizabeth Banks, is a 73 y.o. female, DOB - 04/02/1946, WUJ:[id number]  Admit date - 09/07/2018   Admitting Physician Lorretta HarpXilin Niu, MD  Outpatient Primary MD for the patient is Patient, No Pcp Per  LOS - 2   Chief Complaint  Patient presents with  . Abdominal Pain        Subjective:    Elizabeth QuickLinda Meggett today has no fevers, no further emesis,  No chest pain, pt is emotional and upset due to husband passing away today....  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  Admitted to 11/22/2018,  NG placed  with >1l output and symptomatic improvement. plan for laparoscopic repair and gastrostomy tube insertion probably 09/10/2018 Pt is grieving due to husband passing away on 09/09/2018 suddenly  Plan:- 1)Possible Gastric Volvulus--- surgical consult appreciated, continue NG tube to intermittent wall suction, plan for laparoscopic repair and G-tube placement on 09/10/2018, continue Protonix, continue Zofran PRN,   2)H/o ETOH Abuse--- continue lorazepam per CIWA protocol, continue folic acid 1 mg daily, continue thiamine 100 mg daily  3)Hypothyroidism--- stable, at home patient was on 100 mcg levothyroxine, okay to give IV levothyroxine 50 mcg daily  4)H/o Asthma--- stable, no flareup continue PRN bronchodilators  5)Grief--- grieving due to husband passing away on  09/09/2018, spiritual care/chaplain consult requested  Disposition/Need for in-Hospital Stay- patient unable to be discharged at this time due to inability to tolerate oral intake, patient continues to require NG tube suction and  IV fluids pending surgical intervention  Code Status : Full  Family Communication:   Son at bedside   Disposition Plan  : Home postoperatively  Consults  :  Gen surgery  DVT Prophylaxis  :    - SCDs   Lab Results  Component Value Date   PLT 216 09/08/2018    Inpatient Medications  Scheduled Meds: . fluticasone furoate-vilanterol  1 puff Inhalation Daily  . folic acid  1 mg Oral Daily  . levothyroxine  50 mcg Intravenous Daily  . LORazepam  0-4 mg Intravenous Q6H   Followed by  . LORazepam  0-4 mg Intravenous Q12H  .  morphine injection  4 mg Intravenous Once  . multivitamin with minerals  1 tablet Oral Daily  . pantoprazole  40 mg Intravenous Q12H  . thiamine  100 mg Oral Daily   Or  . thiamine  100 mg Intravenous Daily   Continuous Infusions: . sodium chloride Stopped (09/08/18 1648)  . dextrose 5 % and 0.45 % NaCl with KCl 20 mEq/L 125 mL/hr at 09/09/18 1111  . potassium chloride 10 mEq (09/09/18 1403)   PRN Meds:.acetaminophen, albuterol, LORazepam **OR** LORazepam, nitroGLYCERIN, ondansetron (ZOFRAN) IV    Anti-infectives (From admission, onward)   None        Objective:   Vitals:   09/08/18 1329 09/08/18 2013 09/09/18 0459 09/09/18 0850  BP: (!) 137/59 (!) 185/64 (!) 177/80  Pulse: 61 (!) 54 63 65  Resp: 16 16 16 16   Temp: 98.3 F (36.8 C) 97.9 F (36.6 C) 98.1 F (36.7 C)   TempSrc: Oral Oral Oral   SpO2: 96% 97% 96% 98%  Weight:      Height:        Wt Readings from Last 3 Encounters:  09/07/18 57.2 kg     Intake/Output Summary (Last 24 hours) at 09/09/2018 1450 Last data filed at 09/09/2018 0700 Gross per 24 hour  Intake 2606.86 ml  Output 1250 ml  Net 1356.86 ml    Physical Exam Patient is examined daily including today on 09/09/18 , exams remain the same as of yesterday except that has changed   Gen:- Awake Alert,  In no apparent distress  HEENT:- [place].AT, No sclera icterus Nose- NG tube with gastric contents Neck-Supple Neck,No  JVD,.  Lungs-  CTAB , fair symmetrical air movement CV- S1, S2 normal, regular  Abd-  +ve B.Sounds, less tender, soft, bowel sounds are diminished  extremity/Skin:- No  edema, pedal pulses present  Psych- Grieving sudden death of Husband, Neuro-no new focal deficits, no tremors   Data Review:   Micro Results No results found for this or any previous visit (from the past 240 hour(s)).  Radiology Reports Dg Chest 2 View  Result Date: 09/07/2018 CLINICAL DATA:  Chest pain and vomiting EXAM: CHEST - 2 VIEW COMPARISON:  None. FINDINGS: There is an air-fluid level above the diaphragm posterior to the right heart. There is also an apparent gastric air bubble on the left. There is atelectatic change in each lung base. The lungs elsewhere are clear. Heart size and pulmonary vascularity normal. No adenopathy. No bone lesions. IMPRESSION: Air-fluid level posterior to the right heart. Question large bulla containing fluid. Atypical lung abscess could present in this manner. An atypical appearing hernia is also a differential consideration. Note that there is a suspected gastric air bubble in the expected location in the left upper quadrant. Given this unusual appearance in the absence of prior studies to compare, CT of the chest and upper abdomen, ideally with oral contrast, is felt to be advisable to further assess. Mild bibasilar atelectasis. No frank airspace consolidation evident. Heart size normal. Electronically Signed   By: Bretta Bang III M.D.   On: 09/07/2018 13:57   Ct Chest W Contrast  Result Date: 09/07/2018 CLINICAL DATA:  Epigastric pain.  Nausea and vomiting. EXAM: CT CHEST, ABDOMEN, AND PELVIS WITH CONTRAST TECHNIQUE: Multidetector CT imaging of the chest, abdomen and pelvis was performed following the standard protocol during bolus administration of intravenous contrast. CONTRAST:  ISOVUE-300 IOPAMIDOL (ISOVUE-300) INJECTION 61% COMPARISON:  Two-view chest x-ray of the same day.  FINDINGS: CT CHEST FINDINGS Cardiovascular: Heart size is normal. Coronary artery calcifications are present. A 3 vessel arch configuration is present. Atherosclerotic calcifications are present at the arch and in the descending aorta without aneurysm. Pulmonary arteries are within normal limits. Mediastinum/Nodes: Subcentimeter paratracheal lymph nodes are present. No significant mediastinal or hilar adenopathy is present. There is no significant axillary adenopathy. Thoracic inlet is normal. There is some inflammatory change of the distal esophagus with wall thickening. Lungs/Pleura: Atelectasis is present at the right base adjacent to the herniated stomach. The lungs are otherwise clear. No focal nodule, mass, or airspace disease is present. There is no significant pleural effusion or pneumothorax. Musculoskeletal: Vertebral body heights alignment are normal. No focal lytic or blastic lesions are present. Mild endplate degenerative changes are noted in  the thoracic spine. CT ABDOMEN PELVIS FINDINGS Hepatobiliary: No focal liver abnormality is seen. No gallstones, gallbladder wall thickening, or biliary dilatation. Pancreas: Unremarkable. No pancreatic ductal dilatation or surrounding inflammatory changes. Spleen: Normal in size without focal abnormality. Adrenals/Urinary Tract: Adrenal glands are unremarkable. Kidneys are normal, without renal calculi, focal lesion, or hydronephrosis. Bladder is unremarkable. Stomach/Bowel: The stomach is massively dilated. The antrum extends superior to the GE junction within a paraesophageal hernia. The duodenum extends superiorly into the hernia is well. Duodenum is normal in caliber. Small bowel is unremarkable. Distal small bowel is collapsed. Diverticular changes are present in the scratched at the ascending and transverse colon are within normal limits. Diverticular changes are present in the distal descending and sigmoid colon without inflammatory change. There is  collapse the distal sigmoid colon. Vascular/Lymphatic: Atherosclerotic changes are present within the aorta and branch vessels. There is no aneurysm. Reproductive: Status post hysterectomy. No adnexal masses. Other: No other ventral hernia or free fluid is present. Musculoskeletal: Vertebral body heights alignment are normal. Degenerative changes are noted at the SI joints bilaterally and in the lower lumbar spine facets. Hips are located and normal. IMPRESSION: 1. Marked dilation of the stomach with extension of the antrum into a paraesophageal hernia suggesting gastric volvulus. The stomach is obstructed. 2. Distal bowel is mostly collapsed. 3. Atelectasis the lung bases bilaterally. 4. Inflammatory changes involving the distal esophagus and proximal duodenum, potentially ischemic. 5.  Aortic Atherosclerosis (ICD10-I70.0). 6. Coronary artery disease These results were called by telephone at the time of interpretation on 09/07/2018 at 5:10 pm to Dr. Theda BelfastHRIS TEGELER , who verbally acknowledged these results. Electronically Signed   By: Marin Robertshristopher  Mattern M.D.   On: 09/07/2018 17:11   Ct Abdomen Pelvis W Contrast  Result Date: 09/07/2018 CLINICAL DATA:  Epigastric pain.  Nausea and vomiting. EXAM: CT CHEST, ABDOMEN, AND PELVIS WITH CONTRAST TECHNIQUE: Multidetector CT imaging of the chest, abdomen and pelvis was performed following the standard protocol during bolus administration of intravenous contrast. CONTRAST:  100mL ISOVUE-300 IOPAMIDOL (ISOVUE-300) INJECTION 61% COMPARISON:  Two-view chest x-ray of the same day. FINDINGS: CT CHEST FINDINGS Cardiovascular: Heart size is normal. Coronary artery calcifications are present. A 3 vessel arch configuration is present. Atherosclerotic calcifications are present at the arch and in the descending aorta without aneurysm. Pulmonary arteries are within normal limits. Mediastinum/Nodes: Subcentimeter paratracheal lymph nodes are present. No significant mediastinal or hilar  adenopathy is present. There is no significant axillary adenopathy. Thoracic inlet is normal. There is some inflammatory change of the distal esophagus with wall thickening. Lungs/Pleura: Atelectasis is present at the right base adjacent to the herniated stomach. The lungs are otherwise clear. No focal nodule, mass, or airspace disease is present. There is no significant pleural effusion or pneumothorax. Musculoskeletal: Vertebral body heights alignment are normal. No focal lytic or blastic lesions are present. Mild endplate degenerative changes are noted in the thoracic spine. CT ABDOMEN PELVIS FINDINGS Hepatobiliary: No focal liver abnormality is seen. No gallstones, gallbladder wall thickening, or biliary dilatation. Pancreas: Unremarkable. No pancreatic ductal dilatation or surrounding inflammatory changes. Spleen: Normal in size without focal abnormality. Adrenals/Urinary Tract: Adrenal glands are unremarkable. Kidneys are normal, without renal calculi, focal lesion, or hydronephrosis. Bladder is unremarkable. Stomach/Bowel: The stomach is massively dilated. The antrum extends superior to the GE junction within a paraesophageal hernia. The duodenum extends superiorly into the hernia is well. Duodenum is normal in caliber. Small bowel is unremarkable. Distal small bowel is collapsed. Diverticular changes are present  in the scratched at the ascending and transverse colon are within normal limits. Diverticular changes are present in the distal descending and sigmoid colon without inflammatory change. There is collapse the distal sigmoid colon. Vascular/Lymphatic: Atherosclerotic changes are present within the aorta and branch vessels. There is no aneurysm. Reproductive: Status post hysterectomy. No adnexal masses. Other: No other ventral hernia or free fluid is present. Musculoskeletal: Vertebral body heights alignment are normal. Degenerative changes are noted at the SI joints bilaterally and in the lower lumbar  spine facets. Hips are located and normal. IMPRESSION: 1. Marked dilation of the stomach with extension of the antrum into a paraesophageal hernia suggesting gastric volvulus. The stomach is obstructed. 2. Distal bowel is mostly collapsed. 3. Atelectasis the lung bases bilaterally. 4. Inflammatory changes involving the distal esophagus and proximal duodenum, potentially ischemic. 5.  Aortic Atherosclerosis (ICD10-I70.0). 6. Coronary artery disease These results were called by telephone at the time of interpretation on 09/07/2018 at 5:10 pm to Dr. Theda Belfast , who verbally acknowledged these results. Electronically Signed   By: Marin Roberts M.D.   On: 09/07/2018 17:11   Dg Abd Portable 1v  Result Date: 09/08/2018 CLINICAL DATA:  73 year old female NG tube placement. EXAM: PORTABLE ABDOMEN - 1 VIEW COMPARISON:  CT chest abdomen and pelvis earlier today. FINDINGS: Portable AP supine view at 2325 hours. Enteric tube is looped in the gas dilated stomach, side hole at the level of the gastric body (arrow). Stable and nonobstructed bowel-gas pattern elsewhere. Lung bases appear stable. IMPRESSION: Successful NG tube placement into the stomach, side hole at the level of the gastric body. Electronically Signed   By: Odessa Fleming M.D.   On: 09/08/2018 00:00     CBC Recent Labs  Lab 09/07/18 1515 09/08/18 0656  WBC 7.1 5.7  HGB 14.1 12.0  HCT 42.6 36.3  PLT 270 216  MCV 93.4 92.8  MCH 30.9 30.7  MCHC 33.1 33.1  RDW 13.2 13.0    Chemistries  Recent Labs  Lab 09/07/18 1515 09/08/18 0656 09/09/18 0307 09/09/18 1010  NA 131* 135 136  --   K 4.0 3.2* 3.3*  --   CL 94* 101 101  --   CO2 27 23 24   --   GLUCOSE 88 69* 97  --   BUN 20 11 8   --   CREATININE 0.66 0.77 0.71  --   CALCIUM 8.5* 7.8* 8.0*  --   MG  --   --   --  1.8  AST 22 19  --   --   ALT 13 14  --   --   ALKPHOS 50 35*  --   --   BILITOT 2.1* 1.7*  --   --     ------------------------------------------------------------------------------------------------------------------ Recent Labs    09/08/18 0656  CHOL 208*  HDL 52  LDLCALC 143*  TRIG 63  CHOLHDL 4.0    Lab Results  Component Value Date   HGBA1C 5.8 (H) 09/08/2018    Coagulation profile Recent Labs  Lab 09/07/18 2312  INR 1.02    No results for input(s): DDIMER in the last 72 hours.  Cardiac Enzymes Recent Labs  Lab 09/07/18 2312 09/08/18 0656 09/08/18 1036  TROPONINI 0.04* 0.03* 0.03*   ------------------------------------------------------------------------------------------------------------------ No results found for: BNP   Shon Hale M.D on 09/09/2018 at 2:50 PM  Go to [email or web address] - for contact info  [facility] - Office  [phone]

## 2018-09-09 NOTE — Care Management Note (Addendum)
Case Management Note  Patient Details  Name: Elizabeth Banks MRN: [id number] Date of Birth: Jul 08, 1946  Subjective/Objective:                    Action/Plan:  Spoke to patient and son Gaylyn Rong at bedside. Patient visiting from [place] and has travelers insurance. Gaylyn Rong requested NCM to email Marchelle Folks at [email or web address]. Same done and provided my direct number for [facility] to call.  Can assistance authorization for release of information signed by patient and faxed back.   [place] release of information also signed by patient. Awaiting call back.    Expected Discharge Date:                  Expected Discharge Plan:     In-House Referral:     Discharge planning Services     Post Acute Care Choice:    Choice offered to:     DME Arranged:    DME Agency:     HH Arranged:    HH Agency:     Status of Service:  In process, will continue to follow  If discussed at Long Length of Stay Meetings, dates discussed:    Additional Comments:  Kingsley Plan, RN 09/09/2018, 3:22 PM

## 2018-09-10 ENCOUNTER — Inpatient Hospital Stay (HOSPITAL_COMMUNITY): Payer: PRIVATE HEALTH INSURANCE | Admitting: Anesthesiology

## 2018-09-10 ENCOUNTER — Encounter (HOSPITAL_COMMUNITY)
Admission: EM | Disposition: A | Discharge status: Home / Self Care | Payer: Self-pay | Source: Home / Self Care | Attending: Family Medicine

## 2018-09-10 HISTORY — PX: LAPAROSCOPIC INSERTION GASTROSTOMY TUBE: SHX6817

## 2018-09-10 LAB — BASIC METABOLIC PANEL
Anion gap: 10 (ref 5–15)
BUN: <5 mg/dL — ABNORMAL LOW (ref 8–23)
CHLORIDE: 102 mmol/L (ref 98–111)
CO2: 24 mmol/L (ref 22–32)
Calcium: 8.7 mg/dL — ABNORMAL LOW (ref 8.9–10.3)
Creatinine, Ser: 0.72 mg/dL (ref 0.44–1.00)
GFR calc Af Amer: >60 mL/min (ref >60)
GFR calc non Af Amer: >60 mL/min (ref >60)
Glucose, Bld: 136 mg/dL — ABNORMAL HIGH (ref 70–99)
POTASSIUM: 4 mmol/L (ref 3.5–5.1)
Sodium: 136 mmol/L (ref 135–145)

## 2018-09-10 LAB — CBC
HCT: 41 % (ref 36.0–46.0)
Hemoglobin: 13.6 g/dL (ref 12.0–15.0)
MCH: 30.6 pg (ref 26.0–34.0)
MCHC: 33.2 g/dL (ref 30.0–36.0)
MCV: 92.1 fL (ref 80.0–100.0)
NRBC: 0 % (ref 0.0–0.2)
Platelets: 252 10*3/uL (ref 150–400)
RBC: 4.45 MIL/uL (ref 3.87–5.11)
RDW: 13 % (ref 11.5–15.5)
WBC: 6.3 10*3/uL (ref 4.0–10.5)

## 2018-09-10 LAB — GLUCOSE, CAPILLARY: Glucose-Capillary: 128 mg/dL — ABNORMAL HIGH (ref 70–99)

## 2018-09-10 SURGERY — REPAIR, HERNIA, PARAESOPHAGEAL, LAPAROSCOPIC
Anesthesia: General | Site: Abdomen

## 2018-09-10 MED ORDER — SODIUM CHLORIDE 0.9 % IV SOLN
INTRAVENOUS | Status: DC | PRN
  Administered 2018-09-10: 25 ug/min via INTRAVENOUS

## 2018-09-10 MED ORDER — OXYCODONE HCL 5 MG PO TABS: 5.0000 mg | ORAL_TABLET | ORAL | Status: DC | PRN

## 2018-09-10 MED ORDER — PROPOFOL 10 MG/ML IV BOLUS
INTRAVENOUS | Status: DC | PRN
  Administered 2018-09-10: 120 mg via INTRAVENOUS

## 2018-09-10 MED ORDER — PROMETHAZINE HCL 25 MG/ML IJ SOLN: 6.2500 mg | INTRAMUSCULAR | Status: DC | PRN

## 2018-09-10 MED ORDER — ROCURONIUM BROMIDE 10 MG/ML (PF) SYRINGE
PREFILLED_SYRINGE | INTRAVENOUS | Status: DC | PRN
  Administered 2018-09-10: 20 mg via INTRAVENOUS
  Administered 2018-09-10: 50 mg via INTRAVENOUS

## 2018-09-10 MED ORDER — DEXAMETHASONE SODIUM PHOSPHATE 10 MG/ML IJ SOLN
INTRAMUSCULAR | Status: DC | PRN
  Administered 2018-09-10: 5 mg via INTRAVENOUS

## 2018-09-10 MED ORDER — ALBUMIN HUMAN 5 % IV SOLN
INTRAVENOUS | Status: DC | PRN
  Administered 2018-09-10 (×2): via INTRAVENOUS

## 2018-09-10 MED ORDER — CEFAZOLIN SODIUM 1 G IJ SOLR
INTRAMUSCULAR | Status: AC
  Filled 2018-09-10: qty 20

## 2018-09-10 MED ORDER — FENTANYL CITRATE (PF) 250 MCG/5ML IJ SOLN
INTRAMUSCULAR | Status: AC
  Filled 2018-09-10: qty 5

## 2018-09-10 MED ORDER — ACETAMINOPHEN 10 MG/ML IV SOLN
INTRAVENOUS | Status: DC | PRN
  Administered 2018-09-10: 1000 mg via INTRAVENOUS

## 2018-09-10 MED ORDER — MIDAZOLAM HCL 2 MG/2ML IJ SOLN
INTRAMUSCULAR | Status: AC
  Filled 2018-09-10: qty 2

## 2018-09-10 MED ORDER — MIDAZOLAM HCL 5 MG/5ML IJ SOLN
INTRAMUSCULAR | Status: DC | PRN
  Administered 2018-09-10: 1 mg via INTRAVENOUS

## 2018-09-10 MED ORDER — BUPIVACAINE-EPINEPHRINE 0.5% -1:200000 IJ SOLN
INTRAMUSCULAR | Status: AC
  Filled 2018-09-10: qty 1

## 2018-09-10 MED ORDER — ACETAMINOPHEN 10 MG/ML IV SOLN
INTRAVENOUS | Status: AC
  Filled 2018-09-10: qty 100

## 2018-09-10 MED ORDER — LACTATED RINGERS IV SOLN
INTRAVENOUS | Status: DC | PRN
  Administered 2018-09-10 (×2): via INTRAVENOUS

## 2018-09-10 MED ORDER — DEXAMETHASONE SODIUM PHOSPHATE 10 MG/ML IJ SOLN
INTRAMUSCULAR | Status: AC
  Filled 2018-09-10: qty 1

## 2018-09-10 MED ORDER — SUCCINYLCHOLINE CHLORIDE 20 MG/ML IJ SOLN
INTRAMUSCULAR | Status: DC | PRN
  Administered 2018-09-10: 80 mg via INTRAVENOUS

## 2018-09-10 MED ORDER — SODIUM CHLORIDE 0.9 % IR SOLN
Status: DC | PRN
  Administered 2018-09-10: 1000 mL

## 2018-09-10 MED ORDER — ONDANSETRON HCL 4 MG/2ML IJ SOLN
INTRAMUSCULAR | Status: AC
  Filled 2018-09-10: qty 2

## 2018-09-10 MED ORDER — SUGAMMADEX SODIUM 200 MG/2ML IV SOLN
INTRAVENOUS | Status: DC | PRN
  Administered 2018-09-10: 200 mg via INTRAVENOUS

## 2018-09-10 MED ORDER — HYDROMORPHONE HCL 1 MG/ML IJ SOLN
0.2500 mg | INTRAMUSCULAR | Status: DC | PRN
  Administered 2018-09-10 (×2): 0.5 mg via INTRAVENOUS

## 2018-09-10 MED ORDER — ROCURONIUM BROMIDE 50 MG/5ML IV SOSY
PREFILLED_SYRINGE | INTRAVENOUS | Status: AC
  Filled 2018-09-10: qty 5

## 2018-09-10 MED ORDER — LIDOCAINE 2% (20 MG/ML) 5 ML SYRINGE
INTRAMUSCULAR | Status: AC
  Filled 2018-09-10: qty 5

## 2018-09-10 MED ORDER — EPHEDRINE 5 MG/ML INJ
INTRAVENOUS | Status: AC
  Filled 2018-09-10: qty 10

## 2018-09-10 MED ORDER — MORPHINE SULFATE (PF) 2 MG/ML IV SOLN
2.0000 mg | INTRAVENOUS | Status: DC | PRN
  Filled 2018-09-10: qty 1

## 2018-09-10 MED ORDER — BUPIVACAINE-EPINEPHRINE (PF) 0.5% -1:200000 IJ SOLN
INTRAMUSCULAR | Status: DC | PRN
  Administered 2018-09-10: 50 mL

## 2018-09-10 MED ORDER — EPHEDRINE SULFATE 50 MG/ML IJ SOLN
INTRAMUSCULAR | Status: DC | PRN
  Administered 2018-09-10: 15 mg via INTRAVENOUS

## 2018-09-10 MED ORDER — LIDOCAINE 2% (20 MG/ML) 5 ML SYRINGE
INTRAMUSCULAR | Status: DC | PRN
  Administered 2018-09-10: 100 mg via INTRAVENOUS

## 2018-09-10 MED ORDER — ACETAMINOPHEN 325 MG PO TABS
650.0000 mg | ORAL_TABLET | Freq: Four times a day (QID) | ORAL | Status: DC | PRN
  Administered 2018-09-10 – 2018-09-12 (×6): 650 mg via ORAL
  Filled 2018-09-10 (×6): qty 2

## 2018-09-10 MED ORDER — STERILE WATER FOR IRRIGATION IR SOLN
Status: DC | PRN
  Administered 2018-09-10: 1000 mL

## 2018-09-10 MED ORDER — 0.9 % SODIUM CHLORIDE (POUR BTL) OPTIME
TOPICAL | Status: DC | PRN
  Administered 2018-09-10: 1000 mL

## 2018-09-10 MED ORDER — HYDROMORPHONE HCL 1 MG/ML IJ SOLN
INTRAMUSCULAR | Status: AC
  Filled 2018-09-10: qty 1

## 2018-09-10 MED ORDER — FENTANYL CITRATE (PF) 250 MCG/5ML IJ SOLN
INTRAMUSCULAR | Status: DC | PRN
  Administered 2018-09-10: 50 ug via INTRAVENOUS
  Administered 2018-09-10: 100 ug via INTRAVENOUS
  Administered 2018-09-10 (×2): 50 ug via INTRAVENOUS

## 2018-09-10 SURGICAL SUPPLY — 53 items
BAG URINE DRAINAGE (UROLOGICAL SUPPLIES) ×2 IMPLANT
CANISTER SUCT 3000ML PPV (MISCELLANEOUS) IMPLANT
CHLORAPREP W/TINT 26ML (MISCELLANEOUS) ×3 IMPLANT
COVER SURGICAL LIGHT HANDLE (MISCELLANEOUS) ×3 IMPLANT
DECANTER SPIKE VIAL GLASS SM (MISCELLANEOUS) ×3 IMPLANT
DERMABOND ADVANCED (GAUZE/BANDAGES/DRESSINGS) ×2
DERMABOND ADVANCED .7 DNX12 (GAUZE/BANDAGES/DRESSINGS) IMPLANT
DRAIN PENROSE 18X1/2 LTX STRL (DRAIN) ×2 IMPLANT
ELECT REM PT RETURN 9FT ADLT (ELECTROSURGICAL) ×3
ELECTRODE REM PT RTRN 9FT ADLT (ELECTROSURGICAL) ×1 IMPLANT
GLOVE BIO SURGEON STRL SZ7.5 (GLOVE) ×6 IMPLANT
GLOVE BIOGEL PI IND STRL 6.5 (GLOVE) IMPLANT
GLOVE BIOGEL PI IND STRL 7.0 (GLOVE) ×1 IMPLANT
GLOVE BIOGEL PI IND STRL 7.5 (GLOVE) IMPLANT
GLOVE BIOGEL PI IND STRL 8 (GLOVE) IMPLANT
GLOVE BIOGEL PI INDICATOR 6.5 (GLOVE) ×2
GLOVE BIOGEL PI INDICATOR 7.0 (GLOVE) ×2
GLOVE BIOGEL PI INDICATOR 7.5 (GLOVE) ×4
GLOVE BIOGEL PI INDICATOR 8 (GLOVE) ×2
GLOVE SURG SS PI 6.0 STRL IVOR (GLOVE) ×4 IMPLANT
GLOVE SURG SS PI 6.5 STRL IVOR (GLOVE) ×6 IMPLANT
GLOVE SURG SS PI 7.0 STRL IVOR (GLOVE) ×5 IMPLANT
GOWN STRL REUS W/ TWL LRG LVL3 (GOWN DISPOSABLE) ×3 IMPLANT
GOWN STRL REUS W/ TWL XL LVL3 (GOWN DISPOSABLE) IMPLANT
GOWN STRL REUS W/TWL LRG LVL3 (GOWN DISPOSABLE) ×6
GOWN STRL REUS W/TWL XL LVL3 (GOWN DISPOSABLE) ×2
KIT BASIN OR (CUSTOM PROCEDURE TRAY) ×3 IMPLANT
KIT INTRO ENDO 22F F/GAST TUBE (SET/KITS/TRAYS/PACK) IMPLANT
KIT INTRODUCER MIC G-18 (SET/KITS/TRAYS/PACK) ×3 IMPLANT
KIT TURNOVER KIT B (KITS) ×3 IMPLANT
MARKER SKIN DUAL TIP RULER LAB (MISCELLANEOUS) ×3 IMPLANT
NEEDLE 22X1 1/2 (OR ONLY) (NEEDLE) ×3 IMPLANT
NS IRRIG 1000ML POUR BTL (IV SOLUTION) ×3 IMPLANT
PAD ARMBOARD 7.5X6 YLW CONV (MISCELLANEOUS) ×6 IMPLANT
PENCIL BUTTON HOLSTER BLD 10FT (ELECTRODE) ×2 IMPLANT
POUCH LAPAROSCOPIC INSTRUMENT (MISCELLANEOUS) ×2 IMPLANT
SCISSORS LAP 5X35 DISP (ENDOMECHANICALS) ×3 IMPLANT
SET IRRIG TUBING LAPAROSCOPIC (IRRIGATION / IRRIGATOR) ×2 IMPLANT
SET TUBE SMOKE EVAC HIGH FLOW (TUBING) ×3 IMPLANT
SHEARS HARMONIC ACE PLUS 36CM (ENDOMECHANICALS) ×2 IMPLANT
SLEEVE ENDOPATH XCEL 5M (ENDOMECHANICALS) ×5 IMPLANT
SPONGE DRAIN TRACH 4X4 STRL 2S (GAUZE/BANDAGES/DRESSINGS) ×2 IMPLANT
SUT ETHIBOND 2 0 SH (SUTURE) ×8
SUT ETHIBOND 2 0 SH 36X2 (SUTURE) IMPLANT
SUT MNCRL AB 4-0 PS2 18 (SUTURE) ×5 IMPLANT
SUT SILK 2 0 SH (SUTURE) ×4 IMPLANT
TRAY FOLEY MTR SLVR 14FR STAT (SET/KITS/TRAYS/PACK) ×2 IMPLANT
TRAY LAPAROSCOPIC (CUSTOM PROCEDURE TRAY) ×3 IMPLANT
TRAY LAPAROSCOPIC MC (CUSTOM PROCEDURE TRAY) ×3 IMPLANT
TROCAR XCEL 12X100 BLDLESS (ENDOMECHANICALS) ×3 IMPLANT
TROCAR XCEL NON-BLD 5MMX100MML (ENDOMECHANICALS) ×3 IMPLANT
TUBE GASTROSTOMY 18F (CATHETERS) ×2 IMPLANT
WATER STERILE IRR 1000ML POUR (IV SOLUTION) ×3 IMPLANT

## 2018-09-10 NOTE — Anesthesia Procedure Notes (Signed)
Procedure Name: Intubation Date/Time: 09/10/2018 8:04 AM Performed by: Mariea Clonts, CRNA Pre-anesthesia Checklist: Patient identified, Emergency Drugs available, Suction available and Patient being monitored Patient Re-evaluated:Patient Re-evaluated prior to induction Oxygen Delivery Method: Circle System Utilized Preoxygenation: Pre-oxygenation with 100% oxygen Induction Type: IV induction, Cricoid Pressure applied and Rapid sequence Laryngoscope Size: Mac and 3 Grade View: Grade II Tube type: Oral Tube size: 7.0 mm Number of attempts: 1 Airway Equipment and Method: Stylet and Oral airway Placement Confirmation: ETT inserted through vocal cords under direct vision,  positive ETCO2 and breath sounds checked- equal and bilateral Tube secured with: Tape Dental Injury: Teeth and Oropharynx as per pre-operative assessment

## 2018-09-10 NOTE — Progress Notes (Signed)
Received patient from PACU. Patient drowsy but easily alert. Dressings clean, dry and intact. Family at bedside. Will continue to monitor.

## 2018-09-10 NOTE — Transfer of Care (Signed)
Immediate Anesthesia Transfer of Care Note  Patient: Elizabeth Banks  Procedure(s) Performed: LAPAROSCOPIC PARAESOPHAGEAL HERNIA REPAIR (N/A Abdomen) LAPAROSCOPIC INSERTION GASTROSTOMY TUBE (N/A Abdomen)  Patient Location: PACU  Anesthesia Type:General  Level of Consciousness: awake, alert  and oriented  Airway & Oxygen Therapy: Patient Spontanous Breathing and Patient connected to nasal cannula oxygen  Post-op Assessment: Report given to RN, Post -op Vital signs reviewed and stable and Patient moving all extremities X 4  Post vital signs: Reviewed and stable  Last Vitals:  Vitals Value Taken Time  BP 150/79 09/10/2018 10:45 AM  Temp    Pulse 87 09/10/2018 10:47 AM  Resp 11 09/10/2018 10:47 AM  SpO2 93 % 09/10/2018 10:47 AM  Vitals shown include unvalidated device data.  Last Pain:  Vitals:   09/10/18 0541  TempSrc: Oral  PainSc:          Complications: No apparent anesthesia complications

## 2018-09-10 NOTE — Anesthesia Postprocedure Evaluation (Signed)
Anesthesia Post Note  Patient: Elizabeth Banks  Procedure(s) Performed: LAPAROSCOPIC PARAESOPHAGEAL HERNIA REPAIR (N/A Abdomen) LAPAROSCOPIC INSERTION GASTROSTOMY TUBE (N/A Abdomen)     Patient location during evaluation: PACU Anesthesia Type: General Level of consciousness: sedated Pain management: pain level controlled Vital Signs Assessment: post-procedure vital signs reviewed and stable Respiratory status: spontaneous breathing and respiratory function stable Cardiovascular status: stable Postop Assessment: no apparent nausea or vomiting Anesthetic complications: no    Last Vitals:  Vitals:   09/10/18 1200 09/10/18 1230  BP: 127/76 132/72  Pulse: 74 67  Resp: 10   Temp: (!) 36.2 C 36.6 C  SpO2: 97% 96%    Last Pain:  Vitals:   09/10/18 1150  TempSrc:   PainSc: 5                  Ashyla Luth DANIEL

## 2018-09-10 NOTE — Care Management Note (Signed)
Case Management Note  Patient Details  Name: Elizabeth Banks MRN: [id number] Date of Birth: 03-Aug-1946  Subjective/Objective:                    Action/Plan: See note dated 09-09-18.  Received a call from [place] at can assistance requesting clinical information.  Marchelle Folks phone number [phone] , fax [phone]   Patient's file number is [id number]  Gave above information to [facility] with [facility] , she will fax clinicals.   Marchelle Folks also emailed a Fish farm manager Form, printed same and placed on shadow chart.   Patient and son Elizabeth Banks aware of all of above. Patient wishing to stay in [place] with son to recover from surgery.    Expected Discharge Date:                  Expected Discharge Plan:  Home w Home Health Services  In-House Referral:     Discharge planning Services  CM Consult  Post Acute Care Choice:    Choice offered to:  Patient, Adult Children  DME Arranged:    DME Agency:     HH Arranged:    HH Agency:     Status of Service:  In process, will continue to follow  If discussed at Long Length of Stay Meetings, dates discussed:    Additional Comments:  Kingsley Plan, RN 09/10/2018, 4:34 PM

## 2018-09-10 NOTE — Progress Notes (Signed)
Pt went to OR for surgery. Pre-op checklist done.

## 2018-09-10 NOTE — Op Note (Addendum)
Preoperative diagnosis: type 4 paraesophageal hernia with obstruction  Postoperative diagnosis: same   Procedure: laparoscopic paraesophageal hernia repair with laparoscopic gastrostomy tube insertion  Surgeon: Elizabeth Banks, M.D.  Asst: Elizabeth Banks, Elizabeth Banks  Anesthesia: general  Indications for procedure: Elizabeth Banks is a 73 y.o. year old female with symptoms of nausea, vomiting and abdominal pain found to have a type IV paraesophageal hernia with duodenum obstructing within the hernia. NG tube was placed for decompression and decision was made to proceed with repair of hernia and placement of g tube.  Description of procedure: The patient was brought into the operative suite. Anesthesia was administered with General endotracheal anesthesia. WHO checklist was applied. The patient was then placed in supine position. The area was prepped and draped in the usual sterile fashion.  Next, a small transverse incision was made under the left subcostal space and a 5 mm trocar was used to gain access to the peritoneal cavity with optical entry technique.  Pneumoperitoneum was applied with a high flow and low pressure.  Laparoscope was reinserted to confirm placement.  On initial evaluation of the abdomen majority of the stomach was up in the chest as well as first portion of duodenum.  Next, 3 additional ports were placed 1 5 mm trocar in the right upper abdomen one 12 mm trocar in the right mid abdomen and 1 5 mm trocar in the left mid abdomen.  Marcaine was then used to anesthetize the T AP space bilaterally using 29m on each side.  Stomach was then reduced as well as the duodenum.  Next, a Nathanson retractor was put in place and secured to the table to retract the left lobe of the liver.    Then, a harmonic scalpel was then used to divide the hernia sac away from the peritoneum overlying the right and left crus.  This was continued anteriorly moving towards the left side.  There is a moderate amount  of inflammation at the peritoneum of the left crus.  Small amount of the short gastrics were taken with harmonic scalpel to allow improved visualization of this area.  Next, blunt dissection was used on the posterior aspect to free stomach and esophagus away from the crus of the left crus was identified posteriorly and then a Penrose was wrapped around the GE junction.  Additional blunt dissection was then performed to free the hernia sac away from the chest and esophagus.  The anterior and posterior vagus nerves were identified and left alone.  Once full mobilization was completed the hernia sac was divided with harmonic scalpel.  Care was taken not to injure the nerves.  The hernia was then closed with interrupted 2-0 Ethibonds using extracorporeal knot tying techniques.  Total of 4 stitches were used.    Next, the antrum and duodenum were inspected it appeared that the stomach would fit well against the abdominal wall with the left mid abdomen trocar.  The laparoscopic gastrostomy tube kit was used to secure the stomach against the abdominal wall using 4 T-fasteners and Diamond position around the goal position of the tube located on the anterior portion of the proximal antrum.  Then a 16-gauge needle was used to gain access through the 5 mm trocar port in the left mid abdomen into the stomach and between the 4 fasteners and a J-wire was introduced without resistance.  The tract was then dilated up to a 18 FPakist[place]ize and the 18 FP[place] button was introduced via peel-away sheath.  15 mL  of saline inserted into the balloon.  The balloon was then pulled up to be snug against the abdominal wall and this was at 3-1/2 cm at the skin.  Fastener was placed down.  Pneumoperitoneum was then evacuated.  All incisions were closed with interrupted 4-0 Monocryl.  G-tube was placed to gravity drainage.  Patient awoke from anesthesia was brought to PACU in stable condition.  All counts were correct.  Findings: large  paraesophageal hernia containing the majority stomach and first portion of duodenum in the hernia  Specimen: none  Implant: 18 fr mic G tube   Blood loss: 46m  Local anesthesia: 50 ml marcaine   Complications: none      LGurney Banks M.D. General, Bariatric, & Minimally Invasive Surgery [facility], Elizabeth Banks

## 2018-09-10 NOTE — Progress Notes (Signed)
   09/10/18 1400  Clinical Encounter Type  Visited With Patient  Visit Type Initial  Referral From Nurse  Consult/Referral To Chaplain  Spiritual Encounters  Spiritual Needs Emotional;Prayer  Stress Factors  Patient Stress Factors Major life changes;Loss   Responded to page from Nurse for Spiritual care. PT was resting and no family present. Upon entering PT suddenly requested prayer. Before I started praying, PT spoke of the heaviness she was processing. I offered spiritual care with empathic listening, ministry of presence, and prayer. Noticed great compassion from staff and Nurse.  PT was very thankful for the Chaplain visit. Nurse was thankful for the ministry of presence. Chaplain available upon request.  Chaplain Orest Dikes [phone]

## 2018-09-10 NOTE — Progress Notes (Signed)
Patient Demographics:    Elizabeth Banks, is a 73 y.o. female, DOB - 1946-01-02, ZOX:[id number]  Admit date - 09/07/2018   Admitting Physician Lorretta Harp, MD  Outpatient Primary MD for the patient is Patient, No Pcp Per  LOS - 3   Chief Complaint  Patient presents with  . Abdominal Pain        Subjective:    Elizabeth Banks today has no fevers, no further emesis,  No chest pain, pt is emotional and upset due to husband passing away on 09/09/2018, multiple family members at bedside....  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  Admitted to 11/22/2018,  NG placed  with >1l output and symptomatic improvement. plan for laparoscopic repair and gastrostomy tube insertion probably 09/10/2018 Pt is grieving due to husband passing away on 09/09/2018 suddenly  Plan:- 1)Possible Gastric Volvulus/: type 4 paraesophageal hernia with obstruction---- on 09/10/18 Pt is s/p Laparoscopic paraesophageal hernia repair with laparoscopic gastrostomy tube insertion--- surgical consult appreciated, continue Protonix, continue Zofran PRN, clear liquid diet for now, advance diet per surgical team  2)H/o ETOH Abuse--- continue lorazepam per CIWA protocol, continue folic acid 1 mg daily, continue thiamine 100 mg daily  3)Hypothyroidism--- stable, at home patient was on 100 mcg levothyroxine, okay to give IV levothyroxine 50 mcg daily  4)H/o Asthma--- stable, no flareup continue PRN bronchodilators  5)Grief--- grieving due to husband passing away on  09/09/2018, spiritual care/chaplain consult requested  Disposition/Need for  in-Hospital Stay- patient unable to be discharged at this time until oral intake is reliable , still requiring IV fluids at this time  Code Status : Full  Family Communication:   Sons x 2, daughter at bedside   Disposition Plan  : Home postoperatively  Consults  :  Gen surgery  DVT Prophylaxis  :    - SCDs   Lab Results  Component Value Date   PLT 252 09/10/2018    Inpatient Medications  Scheduled Meds: . fluticasone furoate-vilanterol  1 puff Inhalation Daily  . folic acid  1 mg Oral Daily  . HYDROmorphone      . levothyroxine  50 mcg Intravenous Daily  . LORazepam  0-4 mg Intravenous Q12H  . multivitamin with minerals  1 tablet Oral Daily  . pantoprazole  40 mg Intravenous Q12H  . thiamine  100 mg Oral Daily   Or  . thiamine  100 mg Intravenous Daily   Continuous Infusions: . dextrose 5 % and 0.45 % NaCl with KCl 20 mEq/L 75 mL/hr at 09/10/18 1339   PRN Meds:.acetaminophen, acetaminophen, albuterol, LORazepam **OR** LORazepam, morphine injection, nitroGLYCERIN, ondansetron (ZOFRAN) IV, oxyCODONE, phenol    Anti-infectives (From admission, onward)   Start     Dose/Rate Route Frequency Ordered Stop   09/10/18 0700  metroNIDAZOLE (FLAGYL) IVPB 500 mg     500 mg 100 mL/hr over 60 Minutes Intravenous On call to O.R. 09/09/18 1725 09/10/18 0820  09/10/18 0700  ciprofloxacin (CIPRO) IVPB 400 mg     400 mg 200 mL/hr over 60 Minutes Intravenous On call to O.R. 09/09/18 1725 09/10/18 0805        Objective:   Vitals:   09/10/18 1146 09/10/18 1200 09/10/18 1230 09/10/18 1428  BP:  127/76 132/72 (!) 104/59  Pulse: 67 74 67 63  Resp: (!) 9 10  17   Temp:  (!) 97.2 F (36.2 C) 97.8 F (36.6 C) 98.7 F (37.1 C)  TempSrc:      SpO2: 96% 97% 96% 97%  Weight:      Height:        Wt Readings from Last 3 Encounters:  09/07/18 57.2 kg     Intake/Output Summary (Last 24 hours) at 09/10/2018 1444 Last data filed at 09/10/2018 1203 Gross per 24 hour  Intake 4838.02 ml    Output 1380 ml  Net 3458.02 ml    Physical Exam Patient is examined daily including today on 09/10/18 , exams remain the same as of yesterday except that has changed   Gen:- Awake Alert,  In no apparent distress  HEENT:- [place].AT, No sclera icterus Neck-Supple Neck,No JVD,.  Lungs-  CTAB , fair symmetrical air movement CV- S1, S2 normal, regular  Abd-  +ve B.Sounds, appropriate postop tenderness, G-tube in situ extremity/Skin:- No  edema, pedal pulses present  Psych- Grieving sudden death of Husband, affect is appropriate given the circumstances Neuro-no new focal deficits, no tremors   Data Review:   Micro Results Recent Results (from the past 240 hour(s))  Surgical pcr screen     Status: None   Collection Time: 09/09/18  8:36 PM  Result Value Ref Range Status   MRSA, PCR NEGATIVE NEGATIVE Final   Staphylococcus aureus NEGATIVE NEGATIVE Final    Comment: (NOTE) The Xpert SA Assay (FDA approved for NASAL specimens in patients 42 years of age and older), is one component of a comprehensive surveillance program. It is not intended to diagnose infection nor to guide or monitor treatment. Performed at [facility], [address], [place], [place] [address]     Radiology Reports Dg Chest 2 View  Result Date: 09/07/2018 CLINICAL DATA:  Chest pain and vomiting EXAM: CHEST - 2 VIEW COMPARISON:  None. FINDINGS: There is an air-fluid level above the diaphragm posterior to the right heart. There is also an apparent gastric air bubble on the left. There is atelectatic change in each lung base. The lungs elsewhere are clear. Heart size and pulmonary vascularity normal. No adenopathy. No bone lesions. IMPRESSION: Air-fluid level posterior to the right heart. Question large bulla containing fluid. Atypical lung abscess could present in this manner. An atypical appearing hernia is also a differential consideration. Note that there is a suspected gastric air bubble in the expected location  in the left upper quadrant. Given this unusual appearance in the absence of prior studies to compare, CT of the chest and upper abdomen, ideally with oral contrast, is felt to be advisable to further assess. Mild bibasilar atelectasis. No frank airspace consolidation evident. Heart size normal. Electronically Signed   By: Bretta Bang III M.D.   On: 09/07/2018 13:57   Ct Chest W Contrast  Result Date: 09/07/2018 CLINICAL DATA:  Epigastric pain.  Nausea and vomiting. EXAM: CT CHEST, ABDOMEN, AND PELVIS WITH CONTRAST TECHNIQUE: Multidetector CT imaging of the chest, abdomen and pelvis was performed following the standard protocol during bolus administration of intravenous contrast. CONTRAST:  ISOVUE-300 IOPAMIDOL (  ISOVUE-300) INJECTION 61% COMPARISON:  Two-view chest x-ray of the same day. FINDINGS: CT CHEST FINDINGS Cardiovascular: Heart size is normal. Coronary artery calcifications are present. A 3 vessel arch configuration is present. Atherosclerotic calcifications are present at the arch and in the descending aorta without aneurysm. Pulmonary arteries are within normal limits. Mediastinum/Nodes: Subcentimeter paratracheal lymph nodes are present. No significant mediastinal or hilar adenopathy is present. There is no significant axillary adenopathy. Thoracic inlet is normal. There is some inflammatory change of the distal esophagus with wall thickening. Lungs/Pleura: Atelectasis is present at the right base adjacent to the herniated stomach. The lungs are otherwise clear. No focal nodule, mass, or airspace disease is present. There is no significant pleural effusion or pneumothorax. Musculoskeletal: Vertebral body heights alignment are normal. No focal lytic or blastic lesions are present. Mild endplate degenerative changes are noted in the thoracic spine. CT ABDOMEN PELVIS FINDINGS Hepatobiliary: No focal liver abnormality is seen. No gallstones, gallbladder wall thickening, or biliary dilatation.  Pancreas: Unremarkable. No pancreatic ductal dilatation or surrounding inflammatory changes. Spleen: Normal in size without focal abnormality. Adrenals/Urinary Tract: Adrenal glands are unremarkable. Kidneys are normal, without renal calculi, focal lesion, or hydronephrosis. Bladder is unremarkable. Stomach/Bowel: The stomach is massively dilated. The antrum extends superior to the GE junction within a paraesophageal hernia. The duodenum extends superiorly into the hernia is well. Duodenum is normal in caliber. Small bowel is unremarkable. Distal small bowel is collapsed. Diverticular changes are present in the scratched at the ascending and transverse colon are within normal limits. Diverticular changes are present in the distal descending and sigmoid colon without inflammatory change. There is collapse the distal sigmoid colon. Vascular/Lymphatic: Atherosclerotic changes are present within the aorta and branch vessels. There is no aneurysm. Reproductive: Status post hysterectomy. No adnexal masses. Other: No other ventral hernia or free fluid is present. Musculoskeletal: Vertebral body heights alignment are normal. Degenerative changes are noted at the SI joints bilaterally and in the lower lumbar spine facets. Hips are located and normal. IMPRESSION: 1. Marked dilation of the stomach with extension of the antrum into a paraesophageal hernia suggesting gastric volvulus. The stomach is obstructed. 2. Distal bowel is mostly collapsed. 3. Atelectasis the lung bases bilaterally. 4. Inflammatory changes involving the distal esophagus and proximal duodenum, potentially ischemic. 5.  Aortic Atherosclerosis (ICD10-I70.0). 6. Coronary artery disease These results were called by telephone at the time of interpretation on 09/07/2018 at 5:10 pm to Dr. CHRIS TEGELER , who verbally acknowledged these results. Electronically Signed   By: Christopher  Mattern M.D.   On: 09/07/2018 17:11   Ct Abdomen Pelvis W Contrast  Result  Date: 09/07/2018 CLINICAL DATA:  Epigastric pain.  Nausea and vomiting. EXAM: CT CHEST, ABDOMEN, AND PELVIS WITH CONTRAST TECHNIQUE: Multidetector CT imaging of the chest, abdomen and pelvis was performed following the standard protocol during bolus administration of intravenous contrast. CONTRAST:  <MEASUREMGlSelect Specialty HRidge Wood HMarland Kitch<MEASURMouCaMarland Kitch<MEASUREMENBeersheba SpriTownsGooFayettMarland Kitch<MEASUREMENSigourLynn CounCumberland-PiloMarland KitchePhineas SemenDistrictDOL (ISOVUE-300) INJECTION 61% COMPARISON:  Two-view chest x-ray of the same day. FINDINGS: CT CHEST FINDINGS Cardiovascular: Heart size is normal. Coronary artery calcifications are present. A 3 vessel arch configuration is present. Atherosclerotic calcifications are present at the arch and in the descending aorta without aneurysm. Pulmonary arteries are within normal limits. Mediastinum/Nodes: Subcentimeter paratracheal lymph nodes are present. No significant mediastinal or hilar adenopathy is present. There is no significant axillary adenopathy. Thoracic inlet is normal. There is some inflammatory change of the distal esophagus with wall thickening. Lungs/Pleura: Atelectasis is present at the right base adjacent to the herniated stomach.  The lungs are otherwise clear. No focal nodule, mass, or airspace disease is present. There is no significant pleural effusion or pneumothorax. Musculoskeletal: Vertebral body heights alignment are normal. No focal lytic or blastic lesions are present. Mild endplate degenerative changes are noted in the thoracic spine. CT ABDOMEN PELVIS FINDINGS Hepatobiliary: No focal liver abnormality is seen. No gallstones, gallbladder wall thickening, or biliary dilatation. Pancreas: Unremarkable. No pancreatic ductal dilatation or surrounding inflammatory changes. Spleen: Normal in size without focal abnormality. Adrenals/Urinary Tract: Adrenal glands are unremarkable. Kidneys are normal, without renal calculi, focal lesion, or hydronephrosis. Bladder is unremarkable. Stomach/Bowel: The stomach is massively dilated. The antrum extends superior to the GE junction within a  paraesophageal hernia. The duodenum extends superiorly into the hernia is well. Duodenum is normal in caliber. Small bowel is unremarkable. Distal small bowel is collapsed. Diverticular changes are present in the scratched at the ascending and transverse colon are within normal limits. Diverticular changes are present in the distal descending and sigmoid colon without inflammatory change. There is collapse the distal sigmoid colon. Vascular/Lymphatic: Atherosclerotic changes are present within the aorta and branch vessels. There is no aneurysm. Reproductive: Status post hysterectomy. No adnexal masses. Other: No other ventral hernia or free fluid is present. Musculoskeletal: Vertebral body heights alignment are normal. Degenerative changes are noted at the SI joints bilaterally and in the lower lumbar spine facets. Hips are located and normal. IMPRESSION: 1. Marked dilation of the stomach with extension of the antrum into a paraesophageal hernia suggesting gastric volvulus. The stomach is obstructed. 2. Distal bowel is mostly collapsed. 3. Atelectasis the lung bases bilaterally. 4. Inflammatory changes involving the distal esophagus and proximal duodenum, potentially ischemic. 5.  Aortic Atherosclerosis (ICD10-I70.0). 6. Coronary artery disease These results were called by telephone at the time of interpretation on 09/07/2018 at 5:10 pm to Dr. Theda Belfast , who verbally acknowledged these results. Electronically Signed   By: Marin Roberts M.D.   On: 09/07/2018 17:11   Dg Abd Portable 1v  Result Date: 09/08/2018 CLINICAL DATA:  73 year old female NG tube placement. EXAM: PORTABLE ABDOMEN - 1 VIEW COMPARISON:  CT chest abdomen and pelvis earlier today. FINDINGS: Portable AP supine view at 2325 hours. Enteric tube is looped in the gas dilated stomach, side hole at the level of the gastric body (arrow). Stable and nonobstructed bowel-gas pattern elsewhere. Lung bases appear stable. IMPRESSION: Successful NG  tube placement into the stomach, side hole at the level of the gastric body. Electronically Signed   By: Odessa Fleming M.D.   On: 09/08/2018 00:00     CBC Recent Labs  Lab 09/07/18 1515 09/08/18 0656 09/10/18 0603  WBC 7.1 5.7 6.3  HGB 14.1 12.0 13.6  HCT 42.6 36.3 41.0  PLT 270 216 252  MCV 93.4 92.8 92.1  MCH 30.9 30.7 30.6  MCHC 33.1 33.1 33.2  RDW 13.2 13.0 13.0    Chemistries  Recent Labs  Lab 09/07/18 1515 09/08/18 0656 09/09/18 0307 09/09/18 1010 09/10/18 0603  NA 131* 135 136  --  136  K 4.0 3.2* 3.3*  --  4.0  CL 94* 101 101  --  102  CO2 27 23 24   --  24  GLUCOSE 88 69* 97  --  136*  BUN 20 11 8   --  <5*  CREATININE 0.66 0.77 0.71  --  0.72  CALCIUM 8.5* 7.8* 8.0*  --  8.7*  MG  --   --   --  1.8  --  AST 22 19  --   --   --   ALT 13 14  --   --   --   ALKPHOS 50 35*  --   --   --   BILITOT 2.1* 1.7*  --   --   --    ------------------------------------------------------------------------------------------------------------------ Recent Labs    09/08/18 0656  CHOL 208*  HDL 52  LDLCALC 143*  TRIG 63  CHOLHDL 4.0    Lab Results  Component Value Date   HGBA1C 5.8 (H) 09/08/2018    Coagulation profile Recent Labs  Lab 09/07/18 2312  INR 1.02    No results for input(s): DDIMER in the last 72 hours.  Cardiac Enzymes Recent Labs  Lab 09/07/18 2312 09/08/18 0656 09/08/18 1036  TROPONINI 0.04* 0.03* 0.03*   Corsica Franson M.D on 09/10/2018 at 2:44 PM  Go to [email or web address] - for contact info  [facility] - Office  [phone]

## 2018-09-11 LAB — CBC
HCT: 34.4 % — ABNORMAL LOW (ref 36.0–46.0)
Hemoglobin: 11.5 g/dL — ABNORMAL LOW (ref 12.0–15.0)
MCH: 31.3 pg (ref 26.0–34.0)
MCHC: 33.4 g/dL (ref 30.0–36.0)
MCV: 93.5 fL (ref 80.0–100.0)
Platelets: 253 10*3/uL (ref 150–400)
RBC: 3.68 MIL/uL — ABNORMAL LOW (ref 3.87–5.11)
RDW: 13.1 % (ref 11.5–15.5)
WBC: 8.3 10*3/uL (ref 4.0–10.5)
nRBC: 0 % (ref 0.0–0.2)

## 2018-09-11 LAB — GLUCOSE, CAPILLARY
Glucose-Capillary: 100 mg/dL — ABNORMAL HIGH (ref 70–99)
Glucose-Capillary: 108 mg/dL — ABNORMAL HIGH (ref 70–99)
Glucose-Capillary: 168 mg/dL — ABNORMAL HIGH (ref 70–99)
Glucose-Capillary: 77 mg/dL (ref 70–99)

## 2018-09-11 LAB — BASIC METABOLIC PANEL
Anion gap: 10 (ref 5–15)
CO2: 24 mmol/L (ref 22–32)
Calcium: 8.4 mg/dL — ABNORMAL LOW (ref 8.9–10.3)
Chloride: 102 mmol/L (ref 98–111)
Creatinine, Ser: 0.82 mg/dL (ref 0.44–1.00)
GFR calc Af Amer: >60 mL/min (ref >60)
GFR calc non Af Amer: >60 mL/min (ref >60)
Glucose, Bld: 125 mg/dL — ABNORMAL HIGH (ref 70–99)
Potassium: 4.4 mmol/L (ref 3.5–5.1)
Sodium: 136 mmol/L (ref 135–145)

## 2018-09-11 NOTE — Progress Notes (Signed)
G-tube clamped. Instructed patient to call RN if she experiences nausea.

## 2018-09-11 NOTE — Progress Notes (Signed)
Patient Demographics:    Elizabeth Banks, is a 73 y.o. female, DOB - 1945/11/21, WUJ:[id number]  Admit date - 09/07/2018   Admitting Physician Lorretta Harp, MD  Outpatient Primary MD for the patient is Patient, No Pcp Per  LOS - 4   Chief Complaint  Patient presents with  . Abdominal Pain        Subjective:    [name] today has no fevers, no further emesis,  No chest pain, pt is emotional and upset due to husband passing away on 09/09/2018,daughter at bedside, tolerating liquid diet okay  Assessment  & Plan :    Principal Problem:   Abdominal pain Active Problems:   Gastric volvulus   Asthma   Hypothyroidism   Elevated troponin   Alcohol use  Brief Summary 73 yo female with recurrent nausea/vomting found to have large paraesophageal hernia with majority of antrum and first portion of duodenum in the chest and obstructive symptoms.  Admitted to 11/22/2018,  NG placed  with >1l output and symptomatic improvement. 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 Plan is to remove G-tube probably in about  6 weeks  2)H/o ETOH Abuse--- continue lorazepam per CIWA protocol, continue folic acid 1 mg daily, continue thiamine 100 mg daily  3)Hypothyroidism--- stable, at home patient was on 100 mcg levothyroxine,  okay to give IV levothyroxine 50 mcg daily  4)H/o Asthma--- stable, no flareup continue PRN bronchodilators  5)Grief--- grieving due to husband passing away on  09/09/2018, spiritual care/chaplain consult requested  Disposition/Need for in-Hospital Stay- patient unable to be discharged at this time until oral intake is reliable , still requiring IV fluids at this time  Code Status : Full  Family Communication:   Sons x 2, daughter at bedside   Disposition Plan  : Home postoperatively  Consults  :  Gen surgery  DVT Prophylaxis  :    - SCDs   Lab Results  Component Value Date   PLT 253 09/11/2018    Inpatient Medications  Scheduled Meds: . fluticasone furoate-vilanterol  1 puff Inhalation Daily  . folic acid  1 mg Oral Daily  . levothyroxine  50 mcg Intravenous Daily  . LORazepam  0-4 mg Intravenous Q12H  . multivitamin with minerals  1 tablet Oral Daily  . pantoprazole  40 mg Intravenous Q12H  . thiamine  100 mg Oral Daily   Or  . thiamine  100 mg Intravenous Daily   Continuous Infusions: . dextrose 5 % and 0.45 % NaCl with KCl 20 mEq/L 75 mL/hr at 09/11/18 1727   PRN Meds:.acetaminophen, acetaminophen, albuterol, morphine injection, nitroGLYCERIN, ondansetron (ZOFRAN) IV, oxyCODONE, phenol    Anti-infectives (From admission, onward)   Start  Dose/Rate Route Frequency Ordered Stop   09/10/18 0700  metroNIDAZOLE (FLAGYL) IVPB 500 mg     500 mg 100 mL/hr over 60 Minutes Intravenous On call to O.R. 09/09/18 1725 09/10/18 0820   09/10/18 0700  ciprofloxacin (CIPRO) IVPB 400 mg     400 mg 200 mL/hr over 60 Minutes Intravenous On call to O.R. 09/09/18 1725 09/10/18 0805        Objective:   Vitals:   09/11/18 0240 09/11/18 0625 09/11/18 1014 09/11/18 1525  BP: (!) 110/59 (!) 127/59 136/80 (!) 164/74  Pulse: 60 (!) 58 78 69  Resp: 17 18 18 18   Temp: 98.1 F (36.7 C) 98.5 F (36.9 C) 98.3 F (36.8 C) 98.4 F (36.9 C)  TempSrc: Oral Oral Oral Axillary  SpO2:  96% 95% 97% 100%  Weight:      Height:        Wt Readings from Last 3 Encounters:  09/07/18 57.2 kg     Intake/Output Summary (Last 24 hours) at 09/11/2018 1744 Last data filed at 09/11/2018 1330 Gross per 24 hour  Intake 1443.91 ml  Output 3750 ml  Net -2306.09 ml    Physical Exam Patient is examined daily including today on 09/11/18 , exams remain the same as of yesterday except that has changed   Gen:- Awake Alert,  In no apparent distress  HEENT:- [place].AT, No sclera icterus Neck-Supple Neck,No JVD,.  Lungs-  CTAB , fair symmetrical air movement CV- S1, S2 normal, regular  Abd-  +ve B.Sounds, appropriate postop tenderness, G-tube in situ-now clamped extremity/Skin:- No  edema, pedal pulses present  Psych- Grieving sudden death of Husband, affect is appropriate given the circumstances Neuro-no new focal deficits, no tremors   Data Review:   Micro Results Recent Results (from the past 240 hour(s))  Surgical pcr screen     Status: None   Collection Time: 09/09/18  8:36 PM  Result Value Ref Range Status   MRSA, PCR NEGATIVE NEGATIVE Final   Staphylococcus aureus NEGATIVE NEGATIVE Final    Comment: (NOTE) The Xpert SA Assay (FDA approved for NASAL specimens in patients 28 years of age and older), is one component of a comprehensive surveillance program. It is not intended to diagnose infection nor to guide or monitor treatment. Performed at [facility], [address], [place], [place] [address]     Radiology Reports Dg Chest 2 View  Result Date: 09/07/2018 CLINICAL DATA:  Chest pain and vomiting EXAM: CHEST - 2 VIEW COMPARISON:  None. FINDINGS: There is an air-fluid level above the diaphragm posterior to the right heart. There is also an apparent gastric air bubble on the left. There is atelectatic change in each lung base. The lungs elsewhere are clear. Heart size and pulmonary vascularity normal. No adenopathy. No bone lesions. IMPRESSION: Air-fluid level posterior  to the right heart. Question large bulla containing fluid. Atypical lung abscess could present in this manner. An atypical appearing hernia is also a differential consideration. Note that there is a suspected gastric air bubble in the expected location in the left upper quadrant. Given this unusual appearance in the absence of prior studies to compare, CT of the chest and upper abdomen, ideally with oral contrast, is felt to be advisable to further assess. Mild bibasilar atelectasis. No frank airspace consolidation evident. Heart size normal. Electronically Signed   By: Bretta Bang III M.D.   On: 09/07/2018 13:57   Ct Chest W Contrast  Result Date: 09/07/2018 CLINICAL DATA:  Epigastric pain.  Nausea and vomiting. EXAM: CT CHEST, ABDOMEN, AND PELVIS WITH CONTRAST TECHNIQUE: Multidetector CT imaging of the chest, abdomen and pelvis was performed following the standard protocol during bolus administration of intravenous contrast. CONTRAST:  100mL ISOVUE-300 IOPAMIDOL (ISOVUE-300) INJECTION 61% COMPARISON:  Two-view chest x-ray of the same day. FINDINGS: CT CHEST FINDINGS Cardiovascular: Heart size is normal. Coronary artery calcifications are present. A 3 vessel arch configuration is present. Atherosclerotic calcifications are present at the arch and in the descending aorta without aneurysm. Pulmonary arteries are within normal limits. Mediastinum/Nodes: Subcentimeter paratracheal lymph nodes are present. No significant mediastinal or hilar adenopathy is present. There is no significant axillary adenopathy. Thoracic inlet is normal. There is some inflammatory change of the distal esophagus with wall thickening. Lungs/Pleura: Atelectasis is present at the right base adjacent to the herniated stomach. The lungs are otherwise clear. No focal nodule, mass, or airspace disease is present. There is no significant pleural effusion or pneumothorax. Musculoskeletal: Vertebral body heights alignment are normal. No focal  lytic or blastic lesions are present. Mild endplate degenerative changes are noted in the thoracic spine. CT ABDOMEN PELVIS FINDINGS Hepatobiliary: No focal liver abnormality is seen. No gallstones, gallbladder wall thickening, or biliary dilatation. Pancreas: Unremarkable. No pancreatic ductal dilatation or surrounding inflammatory changes. Spleen: Normal in size without focal abnormality. Adrenals/Urinary Tract: Adrenal glands are unremarkable. Kidneys are normal, without renal calculi, focal lesion, or hydronephrosis. Bladder is unremarkable. Stomach/Bowel: The stomach is massively dilated. The antrum extends superior to the GE junction within a paraesophageal hernia. The duodenum extends superiorly into the hernia is well. Duodenum is normal in caliber. Small bowel is unremarkable. Distal small bowel is collapsed. Diverticular changes are present in the scratched at the ascending and transverse colon are within normal limits. Diverticular changes are present in the distal descending and sigmoid colon without inflammatory change. There is collapse the distal sigmoid colon. Vascular/Lymphatic: Atherosclerotic changes are present within the aorta and branch vessels. There is no aneurysm. Reproductive: Status post hysterectomy. No adnexal masses. Other: No other ventral hernia or free fluid is present. Musculoskeletal: Vertebral body heights alignment are normal. Degenerative changes are noted at the SI joints bilaterally and in the lower lumbar spine facets. Hips are located and normal. IMPRESSION: 1. Marked dilation of the stomach with extension of the antrum into a paraesophageal hernia suggesting gastric volvulus. The stomach is obstructed. 2. Distal bowel is mostly collapsed. 3. Atelectasis the lung bases bilaterally. 4. Inflammatory changes involving the distal esophagus and proximal duodenum, potentially ischemic. 5.  Aortic Atherosclerosis (ICD10-I70.0). 6. Coronary artery disease These results were called  by telephone at the time of interpretation on 09/07/2018 at 5:10 pm to Dr. Theda BelfastHRIS TEGELER , who verbally acknowledged these results. Electronically Signed   By: Marin Robertshristopher  Mattern M.D.   On: 09/07/2018 17:11   Ct Abdomen Pelvis W Contrast  Result Date: 09/07/2018 CLINICAL DATA:  Epigastric pain.  Nausea and vomiting. EXAM: CT CHEST, ABDOMEN, AND PELVIS WITH CONTRAST TECHNIQUE: Multidetector CT imaging of the chest, abdomen and pelvis was performed following the standard protocol during bolus administration of intravenous contrast. CONTRAST:  100mL ISOVUE-300 IOPAMIDOL (ISOVUE-300) INJECTION 61% COMPARISON:  Two-view chest x-ray of the same day. FINDINGS: CT CHEST FINDINGS Cardiovascular: Heart size is normal. Coronary artery calcifications are present. A 3 vessel arch configuration is present. Atherosclerotic calcifications are present at the arch and in the descending aorta without aneurysm. Pulmonary arteries are within normal limits. Mediastinum/Nodes: Subcentimeter paratracheal lymph nodes are present. No significant  mediastinal or hilar adenopathy is present. There is no significant axillary adenopathy. Thoracic inlet is normal. There is some inflammatory change of the distal esophagus with wall thickening. Lungs/Pleura: Atelectasis is present at the right base adjacent to the herniated stomach. The lungs are otherwise clear. No focal nodule, mass, or airspace disease is present. There is no significant pleural effusion or pneumothorax. Musculoskeletal: Vertebral body heights alignment are normal. No focal lytic or blastic lesions are present. Mild endplate degenerative changes are noted in the thoracic spine. CT ABDOMEN PELVIS FINDINGS Hepatobiliary: No focal liver abnormality is seen. No gallstones, gallbladder wall thickening, or biliary dilatation. Pancreas: Unremarkable. No pancreatic ductal dilatation or surrounding inflammatory changes. Spleen: Normal in size without focal abnormality. Adrenals/Urinary  Tract: Adrenal glands are unremarkable. Kidneys are normal, without renal calculi, focal lesion, or hydronephrosis. Bladder is unremarkable. Stomach/Bowel: The stomach is massively dilated. The antrum extends superior to the GE junction within a paraesophageal hernia. The duodenum extends superiorly into the hernia is well. Duodenum is normal in caliber. Small bowel is unremarkable. Distal small bowel is collapsed. Diverticular changes are present in the scratched at the ascending and transverse colon are within normal limits. Diverticular changes are present in the distal descending and sigmoid colon without inflammatory change. There is collapse the distal sigmoid colon. Vascular/Lymphatic: Atherosclerotic changes are present within the aorta and branch vessels. There is no aneurysm. Reproductive: Status post hysterectomy. No adnexal masses. Other: No other ventral hernia or free fluid is present. Musculoskeletal: Vertebral body heights alignment are normal. Degenerative changes are noted at the SI joints bilaterally and in the lower lumbar spine facets. Hips are located and normal. IMPRESSION: 1. Marked dilation of the stomach with extension of the antrum into a paraesophageal hernia suggesting gastric volvulus. The stomach is obstructed. 2. Distal bowel is mostly collapsed. 3. Atelectasis the lung bases bilaterally. 4. Inflammatory changes involving the distal esophagus and proximal duodenum, potentially ischemic. 5.  Aortic Atherosclerosis (ICD10-I70.0). 6. Coronary artery disease These results were called by telephone at the time of interpretation on 09/07/2018 at 5:10 pm to Dr. Theda Belfast , who verbally acknowledged these results. Electronically Signed   By: Marin Roberts M.D.   On: 09/07/2018 17:11   Dg Abd Portable 1v  Result Date: 09/08/2018 CLINICAL DATA:  73 year old female NG tube placement. EXAM: PORTABLE ABDOMEN - 1 VIEW COMPARISON:  CT chest abdomen and pelvis earlier today. FINDINGS:  Portable AP supine view at 2325 hours. Enteric tube is looped in the gas dilated stomach, side hole at the level of the gastric body (arrow). Stable and nonobstructed bowel-gas pattern elsewhere. Lung bases appear stable. IMPRESSION: Successful NG tube placement into the stomach, side hole at the level of the gastric body. Electronically Signed   By: Odessa Fleming M.D.   On: 09/08/2018 00:00     CBC Recent Labs  Lab 09/07/18 1515 09/08/18 0656 09/10/18 0603 09/11/18 0342  WBC 7.1 5.7 6.3 8.3  HGB 14.1 12.0 13.6 11.5*  HCT 42.6 36.3 41.0 34.4*  PLT 270 216 252 253  MCV 93.4 92.8 92.1 93.5  MCH 30.9 30.7 30.6 31.3  MCHC 33.1 33.1 33.2 33.4  RDW 13.2 13.0 13.0 13.1    Chemistries  Recent Labs  Lab 09/07/18 1515 09/08/18 0656 09/09/18 0307 09/09/18 1010 09/10/18 0603 09/11/18 0342  NA 131* 135 136  --  136 136  K 4.0 3.2* 3.3*  --  4.0 4.4  CL 94* 101 101  --  102 102  CO2 27  23 24  --  24 24  GLUCOSE 88 69* 97  --  136* 125*  BUN 20 11 8   --  <5* <5*  CREATININE 0.66 0.77 0.71  --  0.72 0.82  CALCIUM 8.5* 7.8* 8.0*  --  8.7* 8.4*  MG  --   --   --  1.8  --   --   AST 22 19  --   --   --   --   ALT 13 14  --   --   --   --   ALKPHOS 50 35*  --   --   --   --   BILITOT 2.1* 1.7*  --   --   --   --    ------------------------------------------------------------------------------------------------------------------ No results for input(s): CHOL, HDL, LDLCALC, TRIG, CHOLHDL, LDLDIRECT in the last 72 hours.  Lab Results  Component Value Date   HGBA1C 5.8 (H) 09/08/2018    Coagulation profile Recent Labs  Lab 09/07/18 2312  INR 1.02    No results for input(s): DDIMER in the last 72 hours.  Cardiac Enzymes Recent Labs  Lab 09/07/18 2312 09/08/18 0656 09/08/18 1036  TROPONINI 0.04* 0.03* 0.03*   Appolonia Ackert M.D on 09/11/2018 at 5:44 PM  Go to [email or web address] - for contact info  [facility] - Office  [phone]

## 2018-09-11 NOTE — Progress Notes (Signed)
Patient ID: Elizabeth Banks, female   DOB: 03-22-1946, 73 y.o.   MRN: [id number] 1 Day Post-Op   Subjective: Feels well.  Denies pain or nausea.  G-tube is to drainage.  Drinking clear liquids.  Up in chair.  Objective: Vital signs in last 24 hours: Temp:  [97.2 F (36.2 C)-98.7 F (37.1 C)] 98.3 F (36.8 C) (02/08 1014) Pulse Rate:  [58-88] 78 (02/08 1014) Resp:  [8-18] 18 (02/08 1014) BP: (104-150)/(59-80) 136/80 (02/08 1014) SpO2:  [92 %-97 %] 97 % (02/08 1014) Last BM Date: 09/09/18  Intake/Output from previous day: 02/07 0701 - 02/08 0700 In: 3463.9 [P.O.:480; I.V.:2383.9; IV Piggyback:500] Out: 3680 [Urine:3030; Drains:600; Blood:50] Intake/Output this shift: Total I/O In: 150 [P.O.:150] Out: -   General appearance: alert, cooperative and no distress GI: normal findings: soft, non-tender Incisions clean and dry  Lab Results:  Recent Labs    09/10/18 0603 09/11/18 0342  WBC 6.3 8.3  HGB 13.6 11.5*  HCT 41.0 34.4*  PLT 252 253   BMET Recent Labs    09/10/18 0603 09/11/18 0342  NA 136 136  K 4.0 4.4  CL 102 102  CO2 24 24  GLUCOSE 136* 125*  BUN <5* <5*  CREATININE 0.72 0.82  CALCIUM 8.7* 8.4*     Studies/Results: No results found.  Anti-infectives: Anti-infectives (From admission, onward)   Start     Dose/Rate Route Frequency Ordered Stop   09/10/18 0700  metroNIDAZOLE (FLAGYL) IVPB 500 mg     500 mg 100 mL/hr over 60 Minutes Intravenous On call to O.R. 09/09/18 1725 09/10/18 0820   09/10/18 0700  ciprofloxacin (CIPRO) IVPB 400 mg     400 mg 200 mL/hr over 60 Minutes Intravenous On call to O.R. 09/09/18 1725 09/10/18 0805      Assessment/Plan: s/p Procedure(s): LAPAROSCOPIC PARAESOPHAGEAL HERNIA REPAIR LAPAROSCOPIC INSERTION GASTROSTOMY TUBE Doing well without apparent complication.  Clamp gastrostomy tube and allow clear liquids unlimited   LOS: 4 days    Mariella Saa 09/11/2018

## 2018-09-11 NOTE — Plan of Care (Signed)
  Problem: Pain Managment: Goal: General experience of comfort will improve Outcome: Progressing   Problem: Safety: Goal: Ability to remain free from injury will improve Outcome: Progressing   Problem: Skin Integrity: Goal: Risk for impaired skin integrity will decrease Outcome: Progressing   

## 2018-09-12 LAB — GLUCOSE, CAPILLARY
GLUCOSE-CAPILLARY: 77 mg/dL (ref 70–99)
Glucose-Capillary: 76 mg/dL (ref 70–99)
Glucose-Capillary: 81 mg/dL (ref 70–99)

## 2018-09-12 MED ORDER — LEVOTHYROXINE SODIUM 100 MCG PO TABS: 100.0000 ug | ORAL_TABLET | Freq: Every day | ORAL | Status: DC

## 2018-09-12 MED ORDER — ONDANSETRON 4 MG PO TBDP: 4.0000 mg | ORAL_TABLET | Freq: Three times a day (TID) | ORAL | 0 refills | Status: DC | PRN

## 2018-09-12 MED ORDER — FOLIC ACID 1 MG PO TABS: 1.0000 mg | ORAL_TABLET | Freq: Every day | ORAL | 2 refills | Status: DC

## 2018-09-12 MED ORDER — PANTOPRAZOLE SODIUM 40 MG PO TBEC: 40.0000 mg | DELAYED_RELEASE_TABLET | Freq: Two times a day (BID) | ORAL | Status: DC

## 2018-09-12 MED ORDER — ACETAMINOPHEN 325 MG PO TABS: 650.0000 mg | ORAL_TABLET | Freq: Four times a day (QID) | ORAL | 0 refills | Status: AC | PRN

## 2018-09-12 NOTE — Discharge Summary (Signed)
Elizabeth Banks, is a 73 y.o. female  DOB 1946/01/02  MRN [id number].  Admission date:  09/07/2018  Admitting Physician  Lorretta Harp, MD  Discharge Date:  09/12/2018   Primary MD  Patient, No Pcp Per  Recommendations for primary care physician for things to follow:   1)Avoid lifting more than 10 pounds, avoid excessive/strenuous activity for 6 weeks 2)Avoid ibuprofen/Advil/Aleve/Motrin/Goody Powders/Naproxen/BC powders/Meloxicam/Diclofenac/Indomethacin and other Nonsteroidal anti-inflammatory medications as these will make you more likely to bleed and can cause stomach ulcers, can also cause Kidney problems.  3)Take medications as prescribed 4)Full liquid diet advised, push fluids/drink adequate amount of fluids 5)Follow-up with General surgeon Dr. 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 Asthma   . Hypothyroidism   . Thyroid disease     Past Surgical History:  Procedure Laterality Date  . ABDOMINAL HYSTERECTOMY    . NASAL POLYP SURGERY  2001     HPI  from the history and physical done on the day of admission:   Chief Complaint: Nausea, vomiting, abdominal pain  HPI: Elizabeth Banks is a 73 y.o. female with medical history significant of alcohol use, asthma, GERD, hypothyroidism, who presents with nausea, vomiting and abdominal pain.  Patient states that she has been having nausea, vomiting and abdominal pain for more than 5 days, which has been poor aggressively worsening.  Patient states mostly she has dry heaves, no diarrhea.  Her abdominal pain is located in epigastric area, constant, pressure and  burning-like pain, 5 out of 10 severity, nonradiating.  Denies fever or chills.  Patient states that she had similar symptoms last year, and had negative GI work-up.  She was started on Protonix which was effective until 5 days ago.  Patient does not have chest pain, shortness of breath, cough.  No symptoms of UTI.  Patient states that she drinks 1 to 2 glasses of wine each day, several times per week.  ED Course: pt was found to have WBC 7.1, lipase 191, lactic acid 1.1, troponin 0 0.05, negative urinalysis, electrolytes renal function okay, temperature 99, no tachycardia, no tachypnea, oxygen saturation 92 to 96% on room air. CXR showed some abnormalities with unclear etiology, then CT of chest was done, which showed possible gastric vovulus and inflammatory changes involving the distal esophagus and proximal duodenum, potentially ischemic. Pt is admitted to med-surg bed as inpt.  General surgeon, Dr. Fredricka Bonine was consulted.  CT abdomen/pelvis showed: 1. Marked dilation of the stomach with extension of the antrum into a paraesophageal hernia suggesting gastric volvulus. The stomach is obstructed. 2. Distal bowel is mostly collapsed.  3. Atelectasis the lung bases bilaterally. 4. Inflammatory changes involving the distal esophagus and proximal duodenum, potentially ischemic.  Hospital Course:    Brief Summary 73 yo female with recurrent nausea/vomting found to have large paraesophageal hernia with majority ofantrum and first portion of duodenumin the chest and obstructive symptoms.  Admitted to 11/22/2018,  NG placed  with >1l output and symptomatic improvement. 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 Plan is to remove G-tube as outpatient probably in about  6 weeks  2)H/o ETOH Abuse--- stable, no delirium tremens, continue folic acid 1 mg daily, continue thiamine 100 mg daily  3)Hypothyroidism--- stable, at home patient was on 100 mcg levothyroxine, okay to give IV levothyroxine 50 mcg daily  4)H/o Asthma--- stable, no flareup continue PRN bronchodilators  5)Grief--- grieving due to husband passing away on  09/09/2018, spiritual care/Banks consult appreciated  Code Status : Full  Family Communication:   Sons x 2, daughter at bedside  Disposition Plan  : Home    Consults  :  Gen surgery  Discharge Condition: stable  Follow UP--- Dr. Drexel Iha the general surgeon in 2 weeks  Follow-up Information    Kinsinger, De Blanch, MD. Schedule an appointment as soon as possible for a visit in 2 week(s).   Specialty:  General Surgery Contact information: [address] [place] [address] [phone]          Diet and Activity recommendation:  As advised  Discharge Instructions    Discharge Instructions    Call MD for:  difficulty breathing, headache or visual disturbances   Complete by:  As directed    Call MD for:  persistant dizziness or light-headedness   Complete by:  As directed    Call MD for:  persistant nausea and vomiting   Complete by:  As directed    Call MD for:  redness, tenderness, or signs of infection (pain, swelling, redness, odor or green/yellow discharge around incision site)   Complete by:  As directed    Call MD for:  severe uncontrolled pain   Complete by:  As directed    Call MD for:   temperature >100.4   Complete by:  As directed    Diet - low sodium heart healthy   Complete by:  As directed    Full liquid diet advised  Avoid excessive solid food intake----full liquid/soft diet advised   Discharge instructions   Complete by:  As directed    1) avoid lifting more than 10 pounds, avoid excessive/strenuous activity for 6 weeks 2)Avoid ibuprofen/Advil/Aleve/Motrin/Goody Powders/Naproxen/BC powders/Meloxicam/Diclofenac/Indomethacin and other Nonsteroidal anti-inflammatory medications as these will make you more likely to bleed and can cause stomach ulcers, can also cause Kidney problems.  3) take medications as prescribed 4) full liquid diet advised, push fluids/drink adequate amount of fluids 5) follow-up with general surgeon Dr. Rollen Sox in 2 weeks for recheck and reevaluation   Increase activity slowly   Complete by:  As directed    Avoid lifting more than 10 pounds, avoid strenuous activity for 6  weeks        Discharge Medications     Allergies as of 09/12/2018      Reactions   Morphine And Related Other (See Comments)   hallucinations   Other    "some pain pill"   Penicillins Hives   Did it involve swelling of the face/tongue/throat, SOB, or low BP? Unknown Did it involve sudden or severe rash/hives, skin peeling, or any reaction on the inside of your mouth or nose? Unknown Did you need to seek medical attention at a hospital or doctor's office? Unknown When did it last happen? About 30 years ago If all above answers are "NO", may proceed with cephalosporin use.      Medication List    TAKE these medications   acetaminophen 325 MG tablet Commonly known as:  TYLENOL Take 2 tablets (650 mg total) by mouth every 6 (six) hours as needed for mild pain or fever.   b complex vitamins capsule Take 1 capsule by mouth daily.   budesonide 0.5 MG/2ML nebulizer solution Commonly known as:  PULMICORT Take 0.5 mg by nebulization daily.   cholecalciferol  25 MCG (1000 UT) tablet Commonly known as:  VITAMIN D3 Take 1,000 Units by mouth daily.   fluticasone furoate-vilanterol 100-25 MCG/INH Aepb Commonly known as:  BREO ELLIPTA Inhale 1 puff into the lungs daily.   folic acid 1 MG tablet Commonly known as:  FOLVITE Take 1 tablet (1 mg total) by mouth daily. Start taking on:  September 13, 2018   IRON PO Take 150 mg by mouth daily.   levothyroxine 100 MCG tablet Commonly known as:  SYNTHROID, LEVOTHROID Take 100 mcg by mouth daily before breakfast.   Magnesium 200 MG Tabs Take 200 mg by mouth daily.   ondansetron 4 MG disintegrating tablet Commonly known as:  ZOFRAN ODT Take 1 tablet (4 mg total) by mouth every 8 (eight) hours as needed for nausea or vomiting.   pantoprazole 40 MG tablet Commonly known as:  PROTONIX Take 40 mg by mouth daily.       Major procedures and Radiology Reports - PLEASE review detailed and final reports for all details, in brief -   Dg Chest 2 View  Result Date: 09/07/2018 CLINICAL DATA:  Chest pain and vomiting EXAM: CHEST - 2 VIEW COMPARISON:  None. FINDINGS: There is an air-fluid level above the diaphragm posterior to the right heart. There is also an apparent gastric air bubble on the left. There is atelectatic change in each lung base. The lungs elsewhere are clear. Heart size and pulmonary vascularity normal. No adenopathy. No bone lesions. IMPRESSION: Air-fluid level posterior to the right heart. Question large bulla containing fluid. Atypical lung abscess could present in this manner. An atypical appearing hernia is also a differential consideration. Note that there is a suspected gastric air bubble in the expected location in the left upper quadrant. Given this unusual appearance in the absence of prior studies to compare, CT of the chest and upper abdomen, ideally with oral contrast, is felt to be advisable to further assess. Mild bibasilar atelectasis. No frank airspace consolidation evident. Heart  size normal. Electronically Signed   By: Bretta Bang III M.D.   On: 09/07/2018 13:57   Ct Chest W Contrast  Result Date: 09/07/2018 CLINICAL DATA:  Epigastric pain.  Nausea and vomiting. EXAM: CT CHEST, ABDOMEN, AND PELVIS WITH CONTRAST TECHNIQUE: Multidetector CT imaging of the chest, abdomen and pelvis was performed following the standard protocol during bolus administration of  intravenous contrast. CONTRAST:  100mL ISOVUE-300 IOPAMIDOL (ISOVUE-300) INJECTION 61% COMPARISON:  Two-view chest x-ray of the same day. FINDINGS: CT CHEST FINDINGS Cardiovascular: Heart size is normal. Coronary artery calcifications are present. A 3 vessel arch configuration is present. Atherosclerotic calcifications are present at the arch and in the descending aorta without aneurysm. Pulmonary arteries are within normal limits. Mediastinum/Nodes: Subcentimeter paratracheal lymph nodes are present. No significant mediastinal or hilar adenopathy is present. There is no significant axillary adenopathy. Thoracic inlet is normal. There is some inflammatory change of the distal esophagus with wall thickening. Lungs/Pleura: Atelectasis is present at the right base adjacent to the herniated stomach. The lungs are otherwise clear. No focal nodule, mass, or airspace disease is present. There is no significant pleural effusion or pneumothorax. Musculoskeletal: Vertebral body heights alignment are normal. No focal lytic or blastic lesions are present. Mild endplate degenerative changes are noted in the thoracic spine. CT ABDOMEN PELVIS FINDINGS Hepatobiliary: No focal liver abnormality is seen. No gallstones, gallbladder wall thickening, or biliary dilatation. Pancreas: Unremarkable. No pancreatic ductal dilatation or surrounding inflammatory changes. Spleen: Normal in size without focal abnormality. Adrenals/Urinary Tract: Adrenal glands are unremarkable. Kidneys are normal, without renal calculi, focal lesion, or hydronephrosis. Bladder  is unremarkable. Stomach/Bowel: The stomach is massively dilated. The antrum extends superior to the GE junction within a paraesophageal hernia. The duodenum extends superiorly into the hernia is well. Duodenum is normal in caliber. Small bowel is unremarkable. Distal small bowel is collapsed. Diverticular changes are present in the scratched at the ascending and transverse colon are within normal limits. Diverticular changes are present in the distal descending and sigmoid colon without inflammatory change. There is collapse the distal sigmoid colon. Vascular/Lymphatic: Atherosclerotic changes are present within the aorta and branch vessels. There is no aneurysm. Reproductive: Status post hysterectomy. No adnexal masses. Other: No other ventral hernia or free fluid is present. Musculoskeletal: Vertebral body heights alignment are normal. Degenerative changes are noted at the SI joints bilaterally and in the lower lumbar spine facets. Hips are located and normal. IMPRESSION: 1. Marked dilation of the stomach with extension of the antrum into a paraesophageal hernia suggesting gastric volvulus. The stomach is obstructed. 2. Distal bowel is mostly collapsed. 3. Atelectasis the lung bases bilaterally. 4. Inflammatory changes involving the distal esophagus and proximal duodenum, potentially ischemic. 5.  Aortic Atherosclerosis (ICD10-I70.0). 6. Coronary artery disease These results were called by telephone at the time of interpretation on 09/07/2018 at 5:10 pm to Dr. Theda BelfastHRIS TEGELER , who verbally acknowledged these results. Electronically Signed   By: Marin Robertshristopher  Mattern M.D.   On: 09/07/2018 17:11   Ct Abdomen Pelvis W Contrast  Result Date: 09/07/2018 CLINICAL DATA:  Epigastric pain.  Nausea and vomiting. EXAM: CT CHEST, ABDOMEN, AND PELVIS WITH CONTRAST TECHNIQUE: Multidetector CT imaging of the chest, abdomen and pelvis was performed following the standard protocol during bolus administration of intravenous  contrast. CONTRAST:  100mL ISOVUE-300 IOPAMIDOL (ISOVUE-300) INJECTION 61% COMPARISON:  Two-view chest x-ray of the same day. FINDINGS: CT CHEST FINDINGS Cardiovascular: Heart size is normal. Coronary artery calcifications are present. A 3 vessel arch configuration is present. Atherosclerotic calcifications are present at the arch and in the descending aorta without aneurysm. Pulmonary arteries are within normal limits. Mediastinum/Nodes: Subcentimeter paratracheal lymph nodes are present. No significant mediastinal or hilar adenopathy is present. There is no significant axillary adenopathy. Thoracic inlet is normal. There is some inflammatory change of the distal esophagus with wall thickening. Lungs/Pleura: Atelectasis is present at the  right base adjacent to the herniated stomach. The lungs are otherwise clear. No focal nodule, mass, or airspace disease is present. There is no significant pleural effusion or pneumothorax. Musculoskeletal: Vertebral body heights alignment are normal. No focal lytic or blastic lesions are present. Mild endplate degenerative changes are noted in the thoracic spine. CT ABDOMEN PELVIS FINDINGS Hepatobiliary: No focal liver abnormality is seen. No gallstones, gallbladder wall thickening, or biliary dilatation. Pancreas: Unremarkable. No pancreatic ductal dilatation or surrounding inflammatory changes. Spleen: Normal in size without focal abnormality. Adrenals/Urinary Tract: Adrenal glands are unremarkable. Kidneys are normal, without renal calculi, focal lesion, or hydronephrosis. Bladder is unremarkable. Stomach/Bowel: The stomach is massively dilated. The antrum extends superior to the GE junction within a paraesophageal hernia. The duodenum extends superiorly into the hernia is well. Duodenum is normal in caliber. Small bowel is unremarkable. Distal small bowel is collapsed. Diverticular changes are present in the scratched at the ascending and transverse colon are within normal  limits. Diverticular changes are present in the distal descending and sigmoid colon without inflammatory change. There is collapse the distal sigmoid colon. Vascular/Lymphatic: Atherosclerotic changes are present within the aorta and branch vessels. There is no aneurysm. Reproductive: Status post hysterectomy. No adnexal masses. Other: No other ventral hernia or free fluid is present. Musculoskeletal: Vertebral body heights alignment are normal. Degenerative changes are noted at the SI joints bilaterally and in the lower lumbar spine facets. Hips are located and normal. IMPRESSION: 1. Marked dilation of the stomach with extension of the antrum into a paraesophageal hernia suggesting gastric volvulus. The stomach is obstructed. 2. Distal bowel is mostly collapsed. 3. Atelectasis the lung bases bilaterally. 4. Inflammatory changes involving the distal esophagus and proximal duodenum, potentially ischemic. 5.  Aortic Atherosclerosis (ICD10-I70.0). 6. Coronary artery disease These results were called by telephone at the time of interpretation on 09/07/2018 at 5:10 pm to Dr. Theda Belfast , who verbally acknowledged these results. Electronically Signed   By: Marin Roberts M.D.   On: 09/07/2018 17:11   Dg Abd Portable 1v  Result Date: 09/08/2018 CLINICAL DATA:  73 year old female NG tube placement. EXAM: PORTABLE ABDOMEN - 1 VIEW COMPARISON:  CT chest abdomen and pelvis earlier today. FINDINGS: Portable AP supine view at 2325 hours. Enteric tube is looped in the gas dilated stomach, side hole at the level of the gastric body (arrow). Stable and nonobstructed bowel-gas pattern elsewhere. Lung bases appear stable. IMPRESSION: Successful NG tube placement into the stomach, side hole at the level of the gastric body. Electronically Signed   By: Odessa Fleming M.D.   On: 09/08/2018 00:00    Micro Results    Recent Results (from the past 240 hour(s))  Surgical pcr screen     Status: None   Collection Time: 09/09/18   8:36 PM  Result Value Ref Range Status   MRSA, PCR NEGATIVE NEGATIVE Final   Staphylococcus aureus NEGATIVE NEGATIVE Final    Comment: (NOTE) The Xpert SA Assay (FDA approved for NASAL specimens in patients 62 years of age and older), is one component of a comprehensive surveillance program. It is not intended to diagnose infection nor to guide or monitor treatment. Performed at [facility], [address], [place], [place] [address]     Today   Subjective    Elizabeth Banks today has no new complaints,  No Nausea, Vomiting or Diarrhea, tolerating full liquid diet well with clamped G-tube, had a bowel movement        Patient  has been seen and examined prior to discharge   Objective   Blood pressure (!) 167/77, pulse 68, temperature (!) 97.5 F (36.4 C), temperature source Oral, resp. rate 18, height 4\' 11"  (1.499 m), weight 57.2 kg, SpO2 98 %.   Intake/Output Summary (Last 24 hours) at 09/12/2018 1444 Last data filed at 09/12/2018 1300 Gross per 24 hour  Intake 1212.5 ml  Output -  Net 1212.5 ml    Exam Gen:- Awake Alert,  In no apparent distress  HEENT:- [email or web address], No sclera icterus Neck-Supple Neck,No JVD,.  Lungs-  CTAB , fair symmetrical air movement CV- S1, S2 normal, regular  Abd-  +ve B.Sounds, appropriate postop tenderness, G-tube in situ-now clamped extremity/Skin:- No  edema, pedal pulses present  Psych- Grieving sudden death of Husband, affect is appropriate given the circumstances Neuro-no new focal deficits, no tremors   Data Review   CBC w Diff:  Lab Results  Component Value Date   WBC 8.3 09/11/2018   HGB 11.5 (L) 09/11/2018   HCT 34.4 (L) 09/11/2018   PLT 253 09/11/2018    CMP:  Lab Results  Component Value Date   NA 136 09/11/2018   K 4.4 09/11/2018   CL 102 09/11/2018   CO2 24 09/11/2018   BUN <5 (L) 09/11/2018   CREATININE 0.82 09/11/2018   PROT 5.3 (L) 09/08/2018   ALBUMIN 2.8 (L) 09/08/2018   BILITOT 1.7 (H) 09/08/2018    ALKPHOS 35 (L) 09/08/2018   AST 19 09/08/2018   ALT 14 09/08/2018   Total Discharge time is about 33 minutes  Shon Haleourage Mariadelaluz Guggenheim M.D on 09/12/2018 at 2:44 PM  Go to [email or web address] -  for contact info  [facility] - Office  [phone]

## 2018-09-12 NOTE — Progress Notes (Signed)
Patient ID: Elizabeth Banks, female   DOB: Dec 26, 1945, 73 y.o.   MRN: [id number] 2 Days Post-Op   Subjective: Feels well.  Denies pain or nausea.  Tolerating clear liquids without difficulty with G-tube clamped.  Objective: Vital signs in last 24 hours: Temp:  [98.3 F (36.8 C)-98.5 F (36.9 C)] 98.4 F (36.9 C) (02/09 0619) Pulse Rate:  [52-78] 52 (02/09 0619) Resp:  [17-18] 18 (02/09 0619) BP: (136-164)/(66-80) 146/71 (02/09 0619) SpO2:  [95 %-100 %] 95 % (02/09 0619) Last BM Date: 09/09/18  Intake/Output from previous day: 02/08 0701 - 02/09 0700 In: 912.5 [P.O.:420; I.V.:492.5] Out: 1300 [Urine:500; Drains:800] Intake/Output this shift: Total I/O In: 300 [P.O.:300] Out: -   General appearance: alert, cooperative and no distress GI: normal findings: soft, non-tender Incisions clean and dry  Lab Results:  Recent Labs    09/10/18 0603 09/11/18 0342  WBC 6.3 8.3  HGB 13.6 11.5*  HCT 41.0 34.4*  PLT 252 253   BMET Recent Labs    09/10/18 0603 09/11/18 0342  NA 136 136  K 4.0 4.4  CL 102 102  CO2 24 24  GLUCOSE 136* 125*  BUN <5* <5*  CREATININE 0.72 0.82  CALCIUM 8.7* 8.4*     Studies/Results: No results found.  Anti-infectives: Anti-infectives (From admission, onward)   Start     Dose/Rate Route Frequency Ordered Stop   09/10/18 0700  metroNIDAZOLE (FLAGYL) IVPB 500 mg     500 mg 100 mL/hr over 60 Minutes Intravenous On call to O.R. 09/09/18 1725 09/10/18 0820   09/10/18 0700  ciprofloxacin (CIPRO) IVPB 400 mg     400 mg 200 mL/hr over 60 Minutes Intravenous On call to O.R. 09/09/18 1725 09/10/18 0805      Assessment/Plan: s/p Procedure(s): LAPAROSCOPIC PARAESOPHAGEAL HERNIA REPAIR LAPAROSCOPIC INSERTION GASTROSTOMY TUBE Doing well without apparent complication.  Advance to full liquid diet.  If tolerated well she could go home later today.  Follow-up Dr. Sheliah Hatch in 2 weeks.   LOS: 5 days    Mariella Saa 09/12/2018

## 2018-09-12 NOTE — Progress Notes (Signed)
Patient discharged to home. Discharge paperwork reviewed with patient and patient's children. All verbalizes understanding of discharge instructions including g-tube care, discharge medications, and follow up MD visits.

## 2018-09-12 NOTE — Discharge Instructions (Signed)
1) avoid lifting more than 10 pounds, avoid excessive/strenuous activity for 6 weeks 2)Avoid ibuprofen/Advil/Aleve/Motrin/Goody Powders/Naproxen/BC powders/Meloxicam/Diclofenac/Indomethacin and other Nonsteroidal anti-inflammatory medications as these will make you more likely to bleed and can cause stomach ulcers, can also cause Kidney problems.  3) take medications as prescribed 4) full liquid diet advised, push fluids/drink adequate amount of fluids 5) follow-up with general surgeon Dr. Rollen Sox in 2 weeks for recheck and reevaluation    [facility] ______[facility] LAPAROSCOPIC SURGERY: POST OP INSTRUCTIONS Always review your discharge instruction sheet given to you by the facility where your surgery was performed. IF YOU HAVE DISABILITY OR FAMILY LEAVE FORMS, YOU MUST BRING THEM TO THE OFFICE FOR PROCESSING.   DO NOT GIVE THEM TO YOUR DOCTOR.  1. A prescription for pain medication may be given to you upon discharge.  Take your pain medication as prescribed, if needed.  If narcotic pain medicine is not needed, then you may take acetaminophen (Tylenol) or ibuprofen (Advil) as needed. 2. Take your usually prescribed medications unless otherwise directed. 3. If you need a refill on your pain medication, please contact your pharmacy.  They will contact our office to request authorization. Prescriptions will not be filled after 5pm or on week-ends. 4. Liquid or pured diet until your first office visit.   5. Most patients will experience some swelling and bruising in the area of the incisions.  Ice packs will help.  Swelling and bruising can take several days to resolve.  6. It is common to experience some constipation if taking pain medication after surgery.  Increasing fluid intake and taking a stool softener (such as Colace) will usually help or prevent this problem from occurring.  A mild laxative (Milk of Magnesia or Miralax) should be taken according to package instructions if  there are no bowel movements after 48 hours. 7. Unless discharge instructions indicate otherwise, you may remove your bandages 24-48 hours after surgery, and you may shower at that time.  You may have steri-strips (small skin tapes) in place directly over the incision.  These strips should be left on the skin for 7-10 days.  If your surgeon used skin glue on the incision, you may shower in 24 hours.  The glue will flake off over the next 2-3 weeks.  Any sutures or staples will be removed at the office during your follow-up visit. 8. ACTIVITIES:  You may resume regular (light) daily activities beginning the next day--such as daily self-care, walking, climbing stairs--gradually increasing activities as tolerated.  You may have sexual intercourse when it is comfortable.  Refrain from any heavy lifting or straining until approved by your doctor. a. You may drive when you are no longer taking prescription pain medication, you can comfortably wear a seatbelt, and you can safely maneuver your car and apply brakes. b. RETURN TO WORK:  __________________________________________________________ 9. You should see your doctor in the office for a follow-up appointment approximately 2-3 weeks after your surgery.  Make sure that you call for this appointment within a day or two after you arrive home to insure a convenient appointment time. 10. OTHER INSTRUCTIONS: __________________________________________________________________________________________________________________________ __________________________________________________________________________________________________________________________ WHEN TO CALL YOUR DOCTOR: 1. Fever over 101.0 2. Inability to urinate 3. Continued bleeding from incision. 4. Increased pain, redness, or drainage from the incision. 5. Increasing abdominal pain  The clinic staff is available to answer your questions during regular business hours.  Please dont hesitate to call and  ask to speak to one of the nurses for clinical concerns.  If you have a medical emergency, go to the nearest emergency room or call 911.  A surgeon from [facility] is always on call at the hospital. [address], [address], [place], [place]  [address] ? [address] [address], [place], [place]   [address] [phone] ? [phone] ? FAX [phone] Web site: [email or web address]    1) avoid lifting more than 10 pounds, avoid excessive/strenuous activity for 6 weeks 2)Avoid ibuprofen/Advil/Aleve/Motrin/Goody Powders/Naproxen/BC powders/Meloxicam/Diclofenac/Indomethacin and other Nonsteroidal anti-inflammatory medications as these will make you more likely to bleed and can cause stomach ulcers, can also cause Kidney problems.  3) take medications as prescribed 4) full liquid diet advised, push fluids/drink adequate amount of fluids 5) follow-up with general surgeon Dr. Rollen Sox in 2 weeks for recheck and reevaluation

## 2018-09-13 ENCOUNTER — Encounter (HOSPITAL_COMMUNITY): Payer: Self-pay | Admitting: General Surgery

## 2020-10-03 IMAGING — DX DG CHEST 2V
2 series · 2 of 2 positions shown · non-contrast
Comparison: None.

CLINICAL DATA: Chest pain and vomiting

EXAM:
CHEST - 2 VIEW

[chest pa]
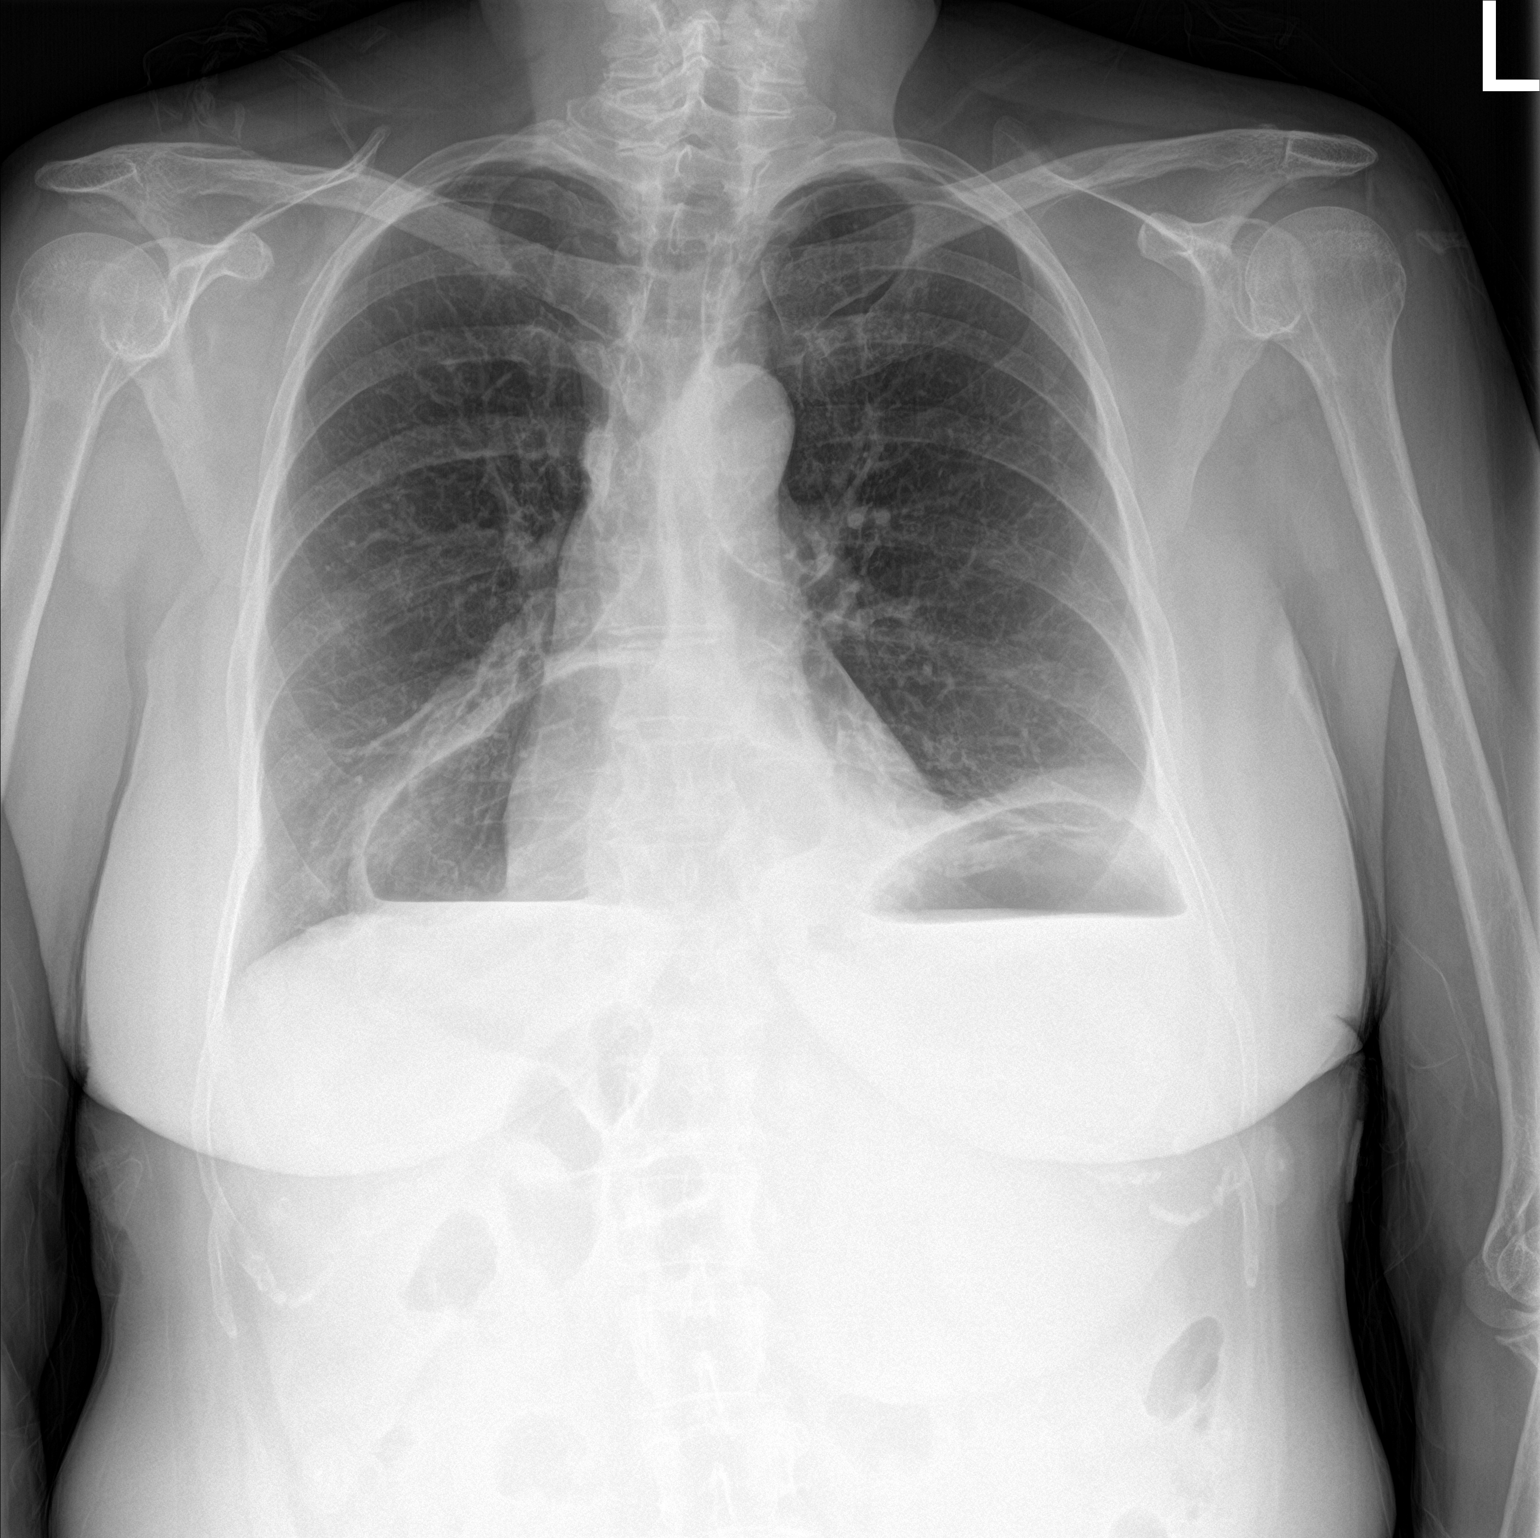

[chest lat]
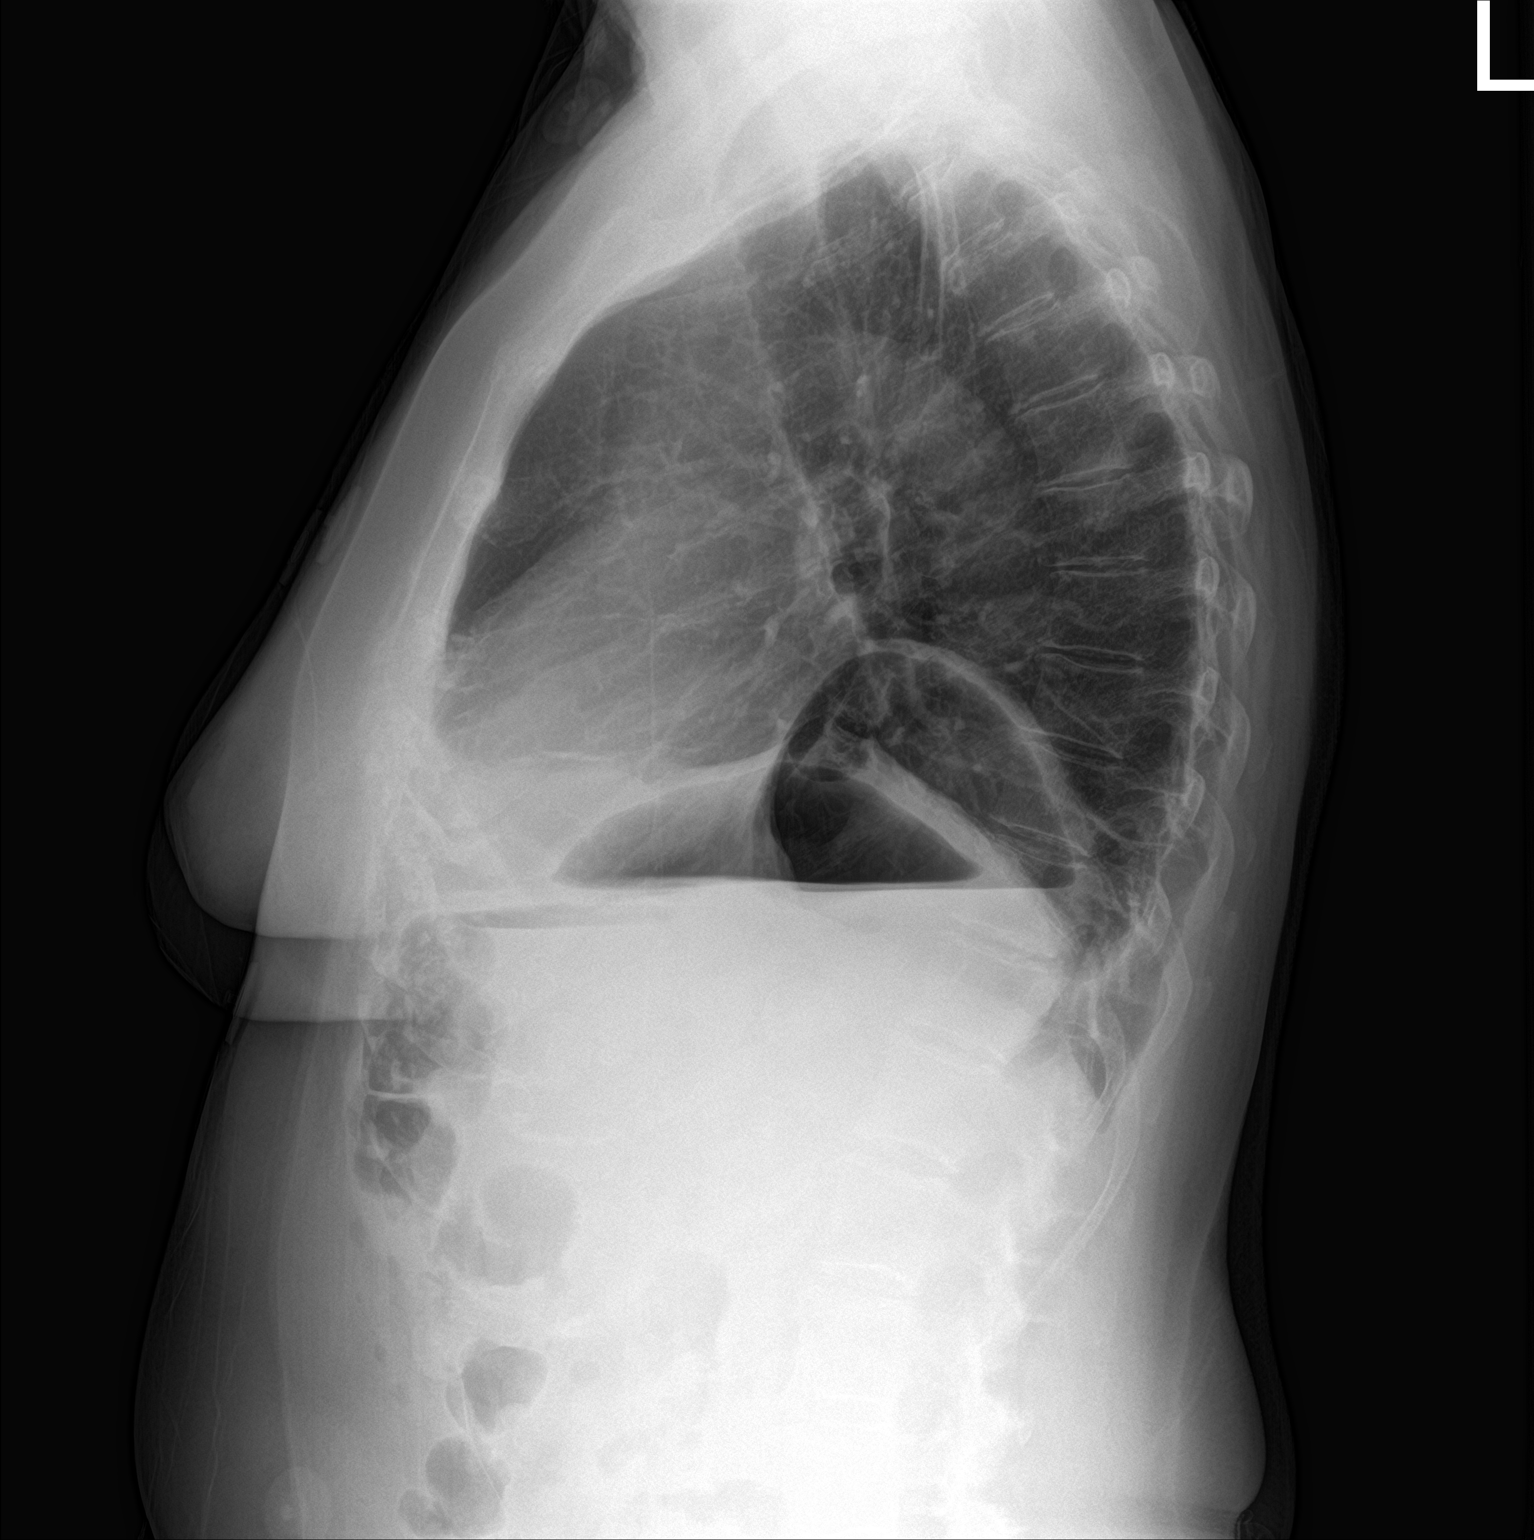

[2 of 2 positions shown; findings below may reference images not displayed]

FINDINGS: There is an air-fluid level above the diaphragm posterior to the
right heart. There is also an apparent gastric air bubble on the
left.

There is atelectatic change in each lung base. The lungs elsewhere
are clear. Heart size and pulmonary vascularity normal. No
adenopathy. No bone lesions.
IMPRESSION: Air-fluid level posterior to the right heart. Question large bulla
containing fluid. Atypical lung abscess could present in this
manner. An atypical appearing hernia is also a differential
consideration. Note that there is a suspected gastric air bubble in
the expected location in the left upper quadrant. Given this unusual
appearance in the absence of prior studies to compare, CT of the
chest and upper abdomen, ideally with oral contrast, is felt to be
advisable to further assess.

Mild bibasilar atelectasis. No frank airspace consolidation evident.
Heart size normal.

## 2020-10-03 IMAGING — CT CT ABD-PELV W/ CM
2 of 5 series · 13 of 36 positions shown, 16 images · IV contrast (iopamidol)
Comparison: Two-view chest x-ray of the same day.

CLINICAL DATA: Epigastric pain.  Nausea and vomiting.

EXAM:
CT CHEST, ABDOMEN, AND PELVIS WITH CONTRAST
TECHNIQUE: Multidetector CT imaging of the chest, abdomen and pelvis was
performed following the standard protocol during bolus
administration of intravenous contrast.
CONTRAST:  100mL [id number] IOPAMIDOL ([id number]) INJECTION 61%

[Series 2: cap with 2 · axial · 0.74mm/px · z∈[+540,+1034]mm · 10 of 122 slices shown, 13 images]
[im 12/122  mediastinal]
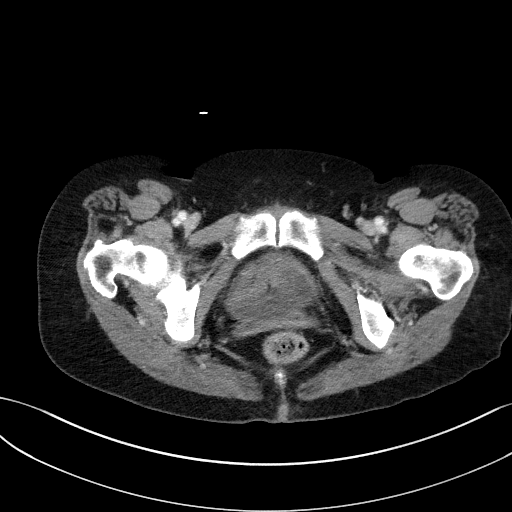
[im 12/122  lung]
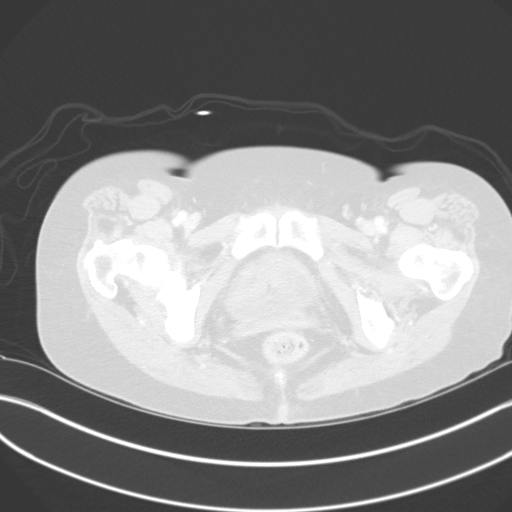
[im 23/122  lung]
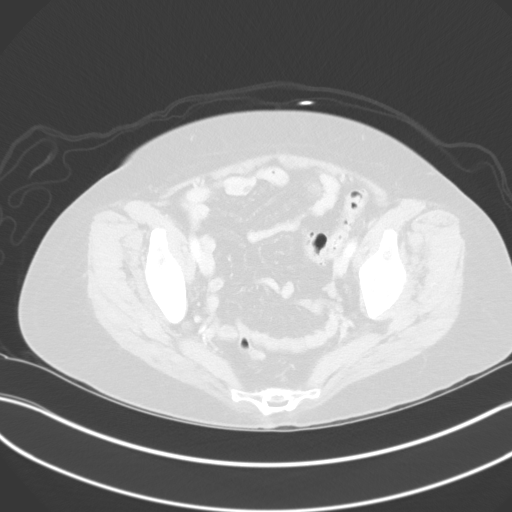
[im 34/122  lung]
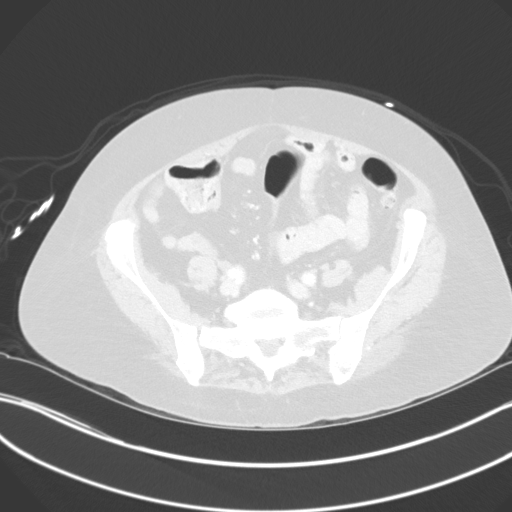
[im 45/122  lung]
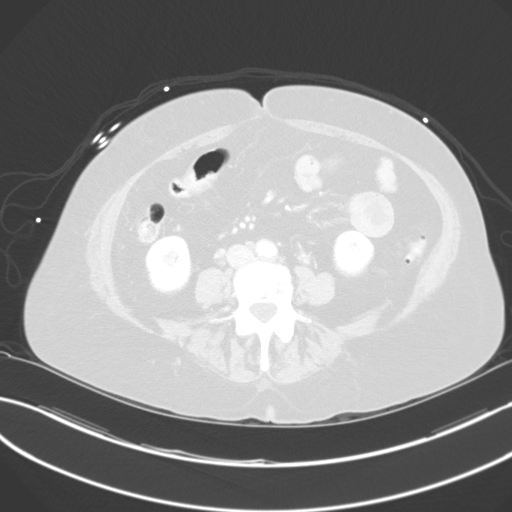
[im 56/122  mediastinal]
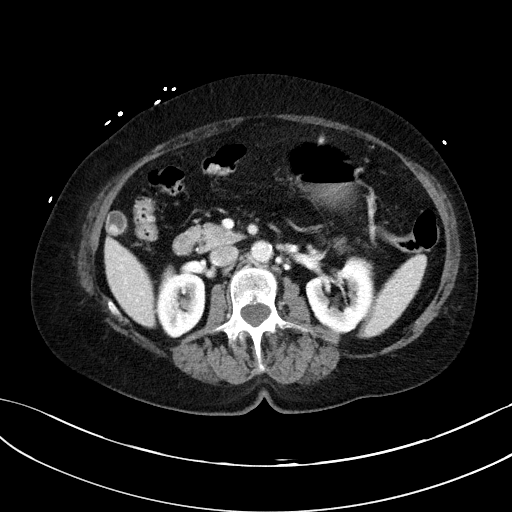
[im 56/122  lung]
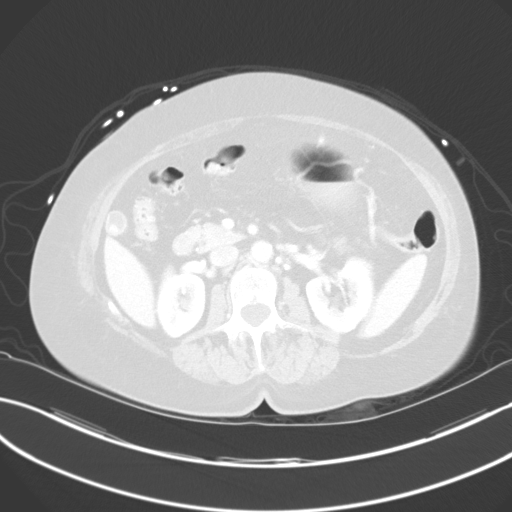
[im 67/122  lung]
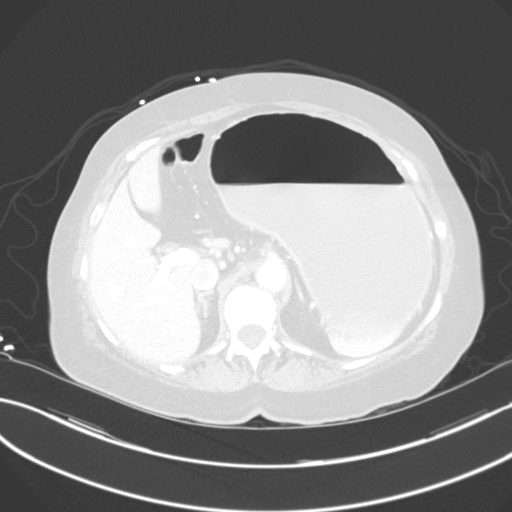
[im 78/122  lung]
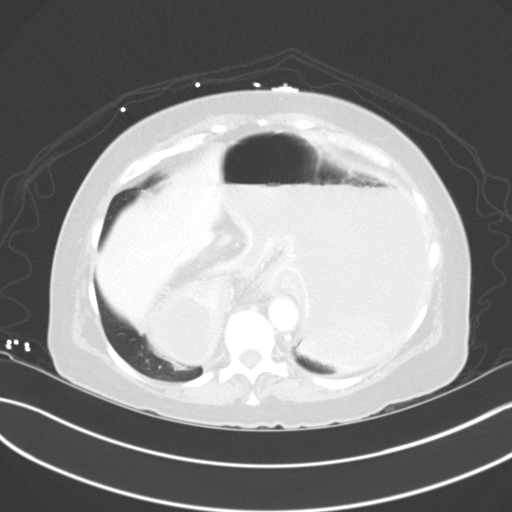
[im 89/122  lung]
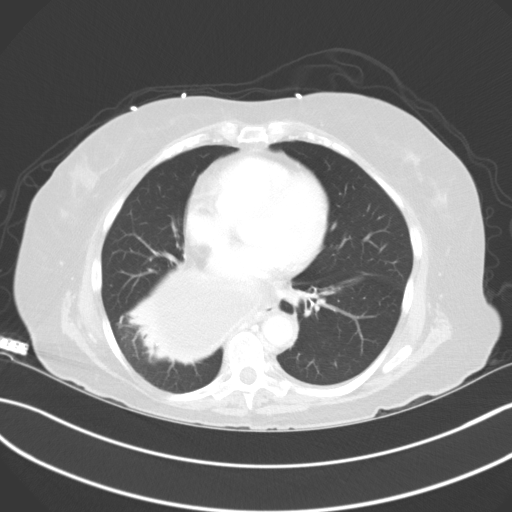
[im 100/122  mediastinal]
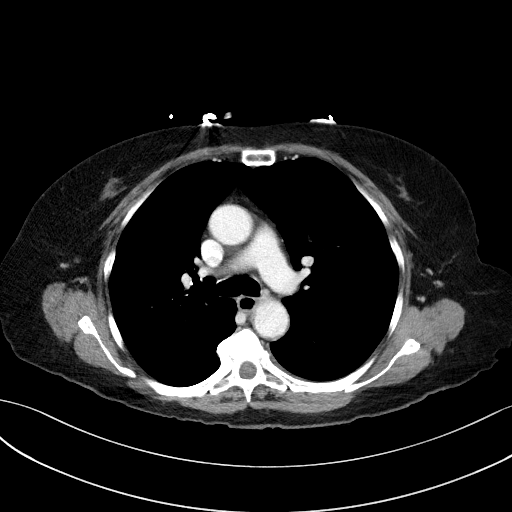
[im 100/122  lung]
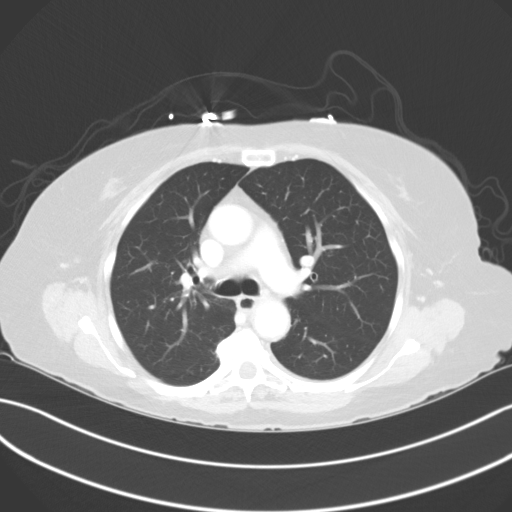
[im 111/122  lung]
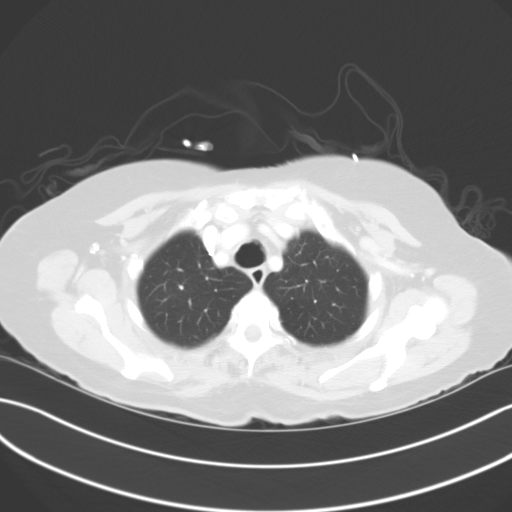

[Series 5: coronals · coronal · 0.82mm/px · 3 of 151 slices shown]
[im 31/151  lung]
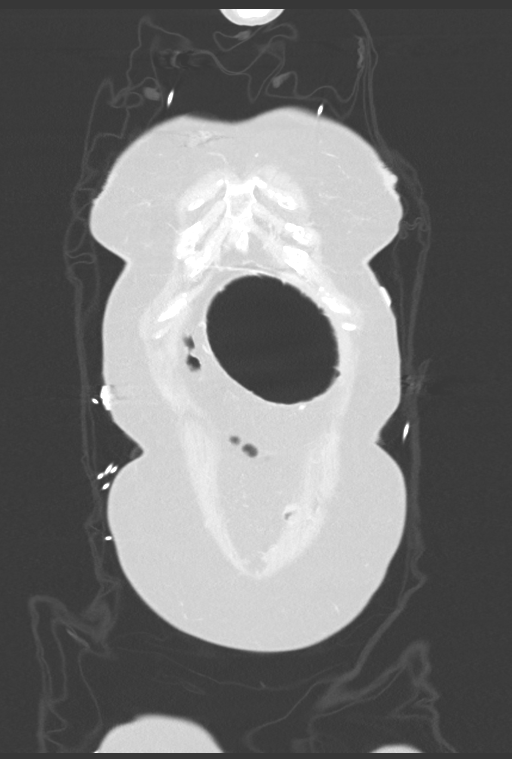
[im 61/151  lung]
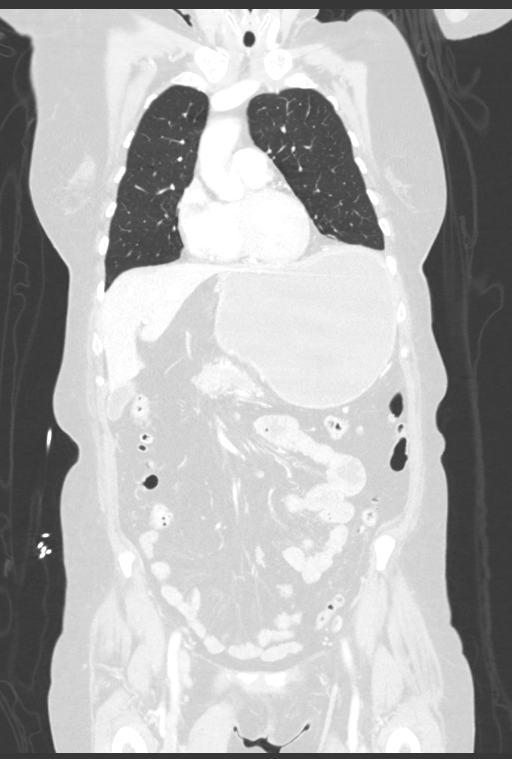
[im 91/151  lung]
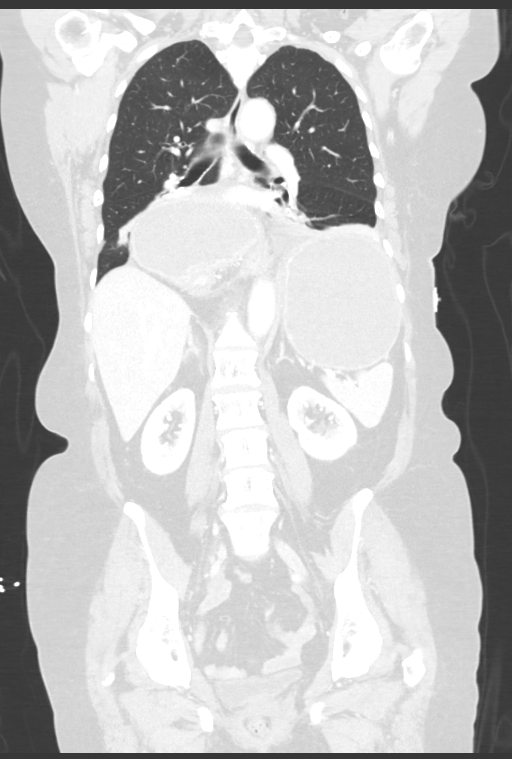

[13 of 36 positions shown; findings below may reference images not displayed]

FINDINGS: CT CHEST FINDINGS

Cardiovascular: Heart size is normal. Coronary artery calcifications
are present. A 3 vessel arch configuration is present.
Atherosclerotic calcifications are present at the arch and in the
descending aorta without aneurysm. Pulmonary arteries are within
normal limits.

Mediastinum/Nodes: Subcentimeter paratracheal lymph nodes are
present. No significant mediastinal or hilar adenopathy is present.
There is no significant axillary adenopathy. Thoracic inlet is
normal. There is some inflammatory change of the distal esophagus
with wall thickening.

Lungs/Pleura: Atelectasis is present at the right base adjacent to
the herniated stomach. The lungs are otherwise clear. No focal
nodule, mass, or airspace disease is present. There is no
significant pleural effusion or pneumothorax.

Musculoskeletal: Vertebral body heights alignment are normal. No
focal lytic or blastic lesions are present. Mild endplate
degenerative changes are noted in the thoracic spine.

CT ABDOMEN PELVIS FINDINGS

Hepatobiliary: No focal liver abnormality is seen. No gallstones,
gallbladder wall thickening, or biliary dilatation.

Pancreas: Unremarkable. No pancreatic ductal dilatation or
surrounding inflammatory changes.

Spleen: Normal in size without focal abnormality.

Adrenals/Urinary Tract: Adrenal glands are unremarkable. Kidneys are
normal, without renal calculi, focal lesion, or hydronephrosis.
Bladder is unremarkable.

Stomach/Bowel: The stomach is massively dilated. The antrum extends
superior to the GE junction within a paraesophageal hernia. The
duodenum extends superiorly into the hernia is well. Duodenum is
normal in caliber. Small bowel is unremarkable. Distal small bowel
is collapsed. Diverticular changes are present in the scratched at
the ascending and transverse colon are within normal limits.
Diverticular changes are present in the distal descending and
sigmoid colon without inflammatory change. There is collapse the
distal sigmoid colon.

Vascular/Lymphatic: Atherosclerotic changes are present within the
aorta and branch vessels. There is no aneurysm.

Reproductive: Status post hysterectomy. No adnexal masses.

Other: No other ventral hernia or free fluid is present.

Musculoskeletal: Vertebral body heights alignment are normal.
Degenerative changes are noted at the SI joints bilaterally and in
the lower lumbar spine facets. Hips are located and normal.
IMPRESSION: 1. Marked dilation of the stomach with extension of the antrum into
a paraesophageal hernia suggesting gastric volvulus. The stomach is
obstructed.
2. Distal bowel is mostly collapsed.
3. Atelectasis the lung bases bilaterally.
4. Inflammatory changes involving the distal esophagus and proximal
duodenum, potentially ischemic.
5.  Aortic Atherosclerosis ([id number]).
6. Coronary artery disease

These results were called by telephone at the time of interpretation
on 09/07/2018 at [DATE] to Dr. DVOSLOJNI MANDALINIC , who verbally
acknowledged these results.

## 2022-07-09 ENCOUNTER — Encounter (HOSPITAL_BASED_OUTPATIENT_CLINIC_OR_DEPARTMENT_OTHER): Payer: Self-pay

## 2022-07-09 ENCOUNTER — Encounter (HOSPITAL_COMMUNITY): Payer: Self-pay

## 2022-07-09 ENCOUNTER — Other Ambulatory Visit: Payer: Self-pay

## 2022-07-09 ENCOUNTER — Observation Stay (HOSPITAL_BASED_OUTPATIENT_CLINIC_OR_DEPARTMENT_OTHER)
Admission: EM | Admit: 2022-07-09 | Discharge: 2022-07-11 | Disposition: A | Discharge status: Home / Self Care | Payer: BC Managed Care – PPO | Attending: Internal Medicine | Admitting: Internal Medicine

## 2022-07-09 ENCOUNTER — Emergency Department (HOSPITAL_BASED_OUTPATIENT_CLINIC_OR_DEPARTMENT_OTHER): Payer: BC Managed Care – PPO

## 2022-07-09 DIAGNOSIS — N3000 Acute cystitis without hematuria: Secondary | ICD-10-CM | POA: Insufficient documentation

## 2022-07-09 DIAGNOSIS — R0789 Other chest pain: Secondary | ICD-10-CM | POA: Insufficient documentation

## 2022-07-09 DIAGNOSIS — R059 Cough, unspecified: Secondary | ICD-10-CM | POA: Diagnosis present

## 2022-07-09 DIAGNOSIS — E039 Hypothyroidism, unspecified: Secondary | ICD-10-CM | POA: Diagnosis not present

## 2022-07-09 DIAGNOSIS — J189 Pneumonia, unspecified organism: Secondary | ICD-10-CM | POA: Diagnosis not present

## 2022-07-09 DIAGNOSIS — F1011 Alcohol abuse, in remission: Secondary | ICD-10-CM | POA: Insufficient documentation

## 2022-07-09 DIAGNOSIS — R0902 Hypoxemia: Secondary | ICD-10-CM

## 2022-07-09 DIAGNOSIS — J45909 Unspecified asthma, uncomplicated: Secondary | ICD-10-CM | POA: Diagnosis not present

## 2022-07-09 DIAGNOSIS — Z79899 Other long term (current) drug therapy: Secondary | ICD-10-CM | POA: Insufficient documentation

## 2022-07-09 DIAGNOSIS — K219 Gastro-esophageal reflux disease without esophagitis: Secondary | ICD-10-CM | POA: Insufficient documentation

## 2022-07-09 DIAGNOSIS — Z1152 Encounter for screening for COVID-19: Secondary | ICD-10-CM | POA: Insufficient documentation

## 2022-07-09 DIAGNOSIS — Z7952 Long term (current) use of systemic steroids: Secondary | ICD-10-CM | POA: Insufficient documentation

## 2022-07-09 DIAGNOSIS — J9601 Acute respiratory failure with hypoxia: Secondary | ICD-10-CM | POA: Diagnosis not present

## 2022-07-09 DIAGNOSIS — N390 Urinary tract infection, site not specified: Secondary | ICD-10-CM | POA: Insufficient documentation

## 2022-07-09 LAB — COMPREHENSIVE METABOLIC PANEL
ALT: 16 U/L (ref 0–44)
AST: 26 U/L (ref 15–41)
Albumin: 3.2 g/dL — ABNORMAL LOW (ref 3.5–5.0)
Alkaline Phosphatase: 62 U/L (ref 38–126)
Anion gap: 9 (ref 5–15)
BUN: 12 mg/dL (ref 8–23)
CO2: 26 mmol/L (ref 22–32)
Calcium: 8.4 mg/dL — ABNORMAL LOW (ref 8.9–10.3)
Chloride: 99 mmol/L (ref 98–111)
Creatinine, Ser: 0.86 mg/dL (ref 0.44–1.00)
GFR, Estimated: >60 mL/min (ref >60)
Glucose, Bld: 119 mg/dL — ABNORMAL HIGH (ref 70–99)
Potassium: 3.6 mmol/L (ref 3.5–5.1)
Sodium: 134 mmol/L — ABNORMAL LOW (ref 135–145)
Total Bilirubin: 0.8 mg/dL (ref 0.3–1.2)
Total Protein: 7.1 g/dL (ref 6.5–8.1)

## 2022-07-09 LAB — CBC
HCT: 36.2 % (ref 36.0–46.0)
Hemoglobin: 12.2 g/dL (ref 12.0–15.0)
MCH: 31 pg (ref 26.0–34.0)
MCHC: 33.7 g/dL (ref 30.0–36.0)
MCV: 91.9 fL (ref 80.0–100.0)
Platelets: 250 10*3/uL (ref 150–400)
RBC: 3.94 MIL/uL (ref 3.87–5.11)
RDW: 13.2 % (ref 11.5–15.5)
WBC: 12 10*3/uL — ABNORMAL HIGH (ref 4.0–10.5)
nRBC: 0 % (ref 0.0–0.2)

## 2022-07-09 LAB — URINALYSIS, ROUTINE W REFLEX MICROSCOPIC
Glucose, UA: NEGATIVE mg/dL
Ketones, ur: NEGATIVE mg/dL
Nitrite: POSITIVE — AB
Protein, ur: 100 mg/dL — AB
Specific Gravity, Urine: 1.025 (ref 1.005–1.030)
pH: 5.5 (ref 5.0–8.0)

## 2022-07-09 LAB — RESP PANEL BY RT-PCR (FLU A&B, COVID) ARPGX2
Influenza A by PCR: NEGATIVE
Influenza B by PCR: NEGATIVE
SARS Coronavirus 2 by RT PCR: NEGATIVE

## 2022-07-09 LAB — URINALYSIS, MICROSCOPIC (REFLEX)

## 2022-07-09 LAB — LIPASE, BLOOD: Lipase: 27 U/L (ref 11–51)

## 2022-07-09 LAB — LACTIC ACID, PLASMA: Lactic Acid, Venous: 1 mmol/L (ref 0.5–1.9)

## 2022-07-09 MED ORDER — SODIUM CHLORIDE 0.9 % IV SOLN
500.0000 mg | Freq: Once | INTRAVENOUS | Status: AC
  Administered 2022-07-09: 500 mg via INTRAVENOUS
  Filled 2022-07-09: qty 5

## 2022-07-09 MED ORDER — SODIUM CHLORIDE 0.9 % IV SOLN
1.0000 g | Freq: Once | INTRAVENOUS | Status: AC
  Administered 2022-07-09: 1 g via INTRAVENOUS
  Filled 2022-07-09: qty 10

## 2022-07-09 NOTE — ED Notes (Signed)
Pt ambulated to the bathroom without assistance or incident.  

## 2022-07-09 NOTE — ED Notes (Signed)
After ambulation to room from lobby, pt became increasingly short of breath. Placed on pulse oximetry after changing, noticed to be at 91%. Increased back to 95-96% after resting on RA. RT notified.

## 2022-07-09 NOTE — ED Provider Notes (Signed)
[facility] EMERGENCY DEPARTMENT Provider Note   CSN: [id number] Arrival date & time: 07/09/22  0841     History  Chief Complaint  Patient presents with   Cough   Vomiting    Elizabeth Banks is a 76 y.o. female.  Patient with complaint of cough for 1 week of fatigue no decreased appetite.  Had an episode of vomiting yesterday.  Only vomited once.  No diarrhea.  Some chest discomfort.  Denies any dysuria.  And ambulation back to the room patient got a coughing fit got very short of breath.  No one else sick at home.  Past medical history significant for thyroid disease asthma and hypothyroidism.  Patient never smoked surgical history sniffing for abdominal hysterectomy.       Home Medications Prior to Admission medications   Medication Sig Start Date End Date Taking? Authorizing Provider  acetaminophen (TYLENOL) 325 MG tablet Take 2 tablets (650 mg total) by mouth every 6 (six) hours as needed for mild pain or fever. 09/12/18   Shon Hale, MD  b complex vitamins capsule Take 1 capsule by mouth daily.    [provider]  budesonide (PULMICORT) 0.5 MG/2ML nebulizer solution Take 0.5 mg by nebulization daily.    [provider]  cholecalciferol (VITAMIN D3) 25 MCG (1000 UT) tablet Take 1,000 Units by mouth daily.    [provider]  fluticasone furoate-vilanterol (BREO ELLIPTA) 100-25 MCG/INH AEPB Inhale 1 puff into the lungs daily.     [provider]  folic acid (FOLVITE) 1 MG tablet Take 1 tablet (1 mg total) by mouth daily. 09/13/18   Emokpae, Courage, MD  IRON PO Take 150 mg by mouth daily.     [provider]  levothyroxine (SYNTHROID, LEVOTHROID) 100 MCG tablet Take 100 mcg by mouth daily before breakfast.     [provider]  Magnesium 200 MG TABS Take 200 mg by mouth daily.    [provider]  ondansetron (ZOFRAN ODT) 4 MG disintegrating tablet Take 1 tablet (4 mg total) by mouth every 8 (eight) hours  as needed for nausea or vomiting. 09/12/18   Shon Hale, MD  pantoprazole (PROTONIX) 40 MG tablet Take 40 mg by mouth daily.     [provider]      Allergies    Morphine and related, Other, and Penicillins    Review of Systems   Review of Systems  Constitutional:  Positive for fatigue. Negative for chills and fever.  HENT:  Positive for congestion. Negative for ear pain and sore throat.   Eyes:  Negative for pain and visual disturbance.  Respiratory:  Positive for cough and shortness of breath.   Cardiovascular:  Negative for chest pain and palpitations.  Gastrointestinal:  Positive for nausea and vomiting. Negative for abdominal pain.  Genitourinary:  Negative for dysuria and hematuria.  Musculoskeletal:  Negative for arthralgias and back pain.  Skin:  Negative for color change and rash.  Neurological:  Negative for seizures and syncope.  All other systems reviewed and are negative.   Physical Exam Updated Vital Signs BP (!) 149/114   Pulse 89   Temp 98.1 F (36.7 C) (Oral)   Resp (!) 21   Ht 1.499 m (4\' 11" )   Wt 61.7 kg   SpO2 91%   BMI 27.47 kg/m  Physical Exam Vitals and nursing note reviewed.  Constitutional:      General: She is not in acute distress.    Appearance: Normal appearance. She  is well-developed. She is not ill-appearing.  HENT:     Head: Normocephalic and atraumatic.     Mouth/Throat:     Mouth: Mucous membranes are moist.  Eyes:     Conjunctiva/sclera: Conjunctivae normal.     Pupils: Pupils are equal, round, and reactive to light.  Cardiovascular:     Rate and Rhythm: Normal rate and regular rhythm.     Heart sounds: No murmur heard. Pulmonary:     Effort: Pulmonary effort is normal. No respiratory distress.     Breath sounds: Normal breath sounds. No stridor. No wheezing, rhonchi or rales.  Abdominal:     Palpations: Abdomen is soft.     Tenderness: There is no abdominal tenderness.  Musculoskeletal:        General: No  swelling.     Cervical back: Normal range of motion and neck supple.  Skin:    General: Skin is warm and dry.     Capillary Refill: Capillary refill takes less than 2 seconds.  Neurological:     General: No focal deficit present.     Mental Status: She is alert.     Cranial Nerves: No cranial nerve deficit.     Sensory: No sensory deficit.     Motor: No weakness.  Psychiatric:        Mood and Affect: Mood normal.     ED Results / Procedures / Treatments   Labs (all labs ordered are listed, but only abnormal results are displayed) Labs Reviewed  COMPREHENSIVE METABOLIC PANEL - Abnormal; Notable for the following components:      Result Value   Sodium 134 (*)    Glucose, Bld 119 (*)    Calcium 8.4 (*)    Albumin 3.2 (*)    All other components within normal limits  CBC - Abnormal; Notable for the following components:   WBC 12.0 (*)    All other components within normal limits  URINALYSIS, ROUTINE W REFLEX MICROSCOPIC - Abnormal; Notable for the following components:   Color, Urine AMBER (*)    APPearance HAZY (*)    Hgb urine dipstick TRACE (*)    Bilirubin Urine SMALL (*)    Protein, ur 100 (*)    Nitrite POSITIVE (*)    Leukocytes,Ua LARGE (*)    All other components within normal limits  URINALYSIS, MICROSCOPIC (REFLEX) - Abnormal; Notable for the following components:   Bacteria, UA RARE (*)    All other components within normal limits  RESP PANEL BY RT-PCR (FLU A&B, COVID) ARPGX2  URINE CULTURE  LIPASE, BLOOD    EKG None  Radiology DG Chest 2 View  Result Date: 07/09/2022 CLINICAL DATA:  Cough for 1 week, anorexia, vomiting EXAM: CHEST - 2 VIEW COMPARISON:  09/07/2018 FINDINGS: Normal heart size, mediastinal contours, and pulmonary vascularity. Patchy infiltrates at mid lungs and LEFT base favoring multifocal pneumonia. No pleural effusion or pneumothorax. Osseous structures unremarkable. Large hiatal hernia seen on previous exam no longer identified.  IMPRESSION: Patchy infiltrates in the mid lungs and at LEFT base consistent with pneumonia. Electronically Signed   By: Ulyses Southward M.D.   On: 07/09/2022 09:21    Procedures Procedures    Medications Ordered in ED Medications  azithromycin (ZITHROMAX) 500 mg in sodium chloride 0.9 % 250 mL IVPB (500 mg Intravenous New Bag/Given 07/09/22 1227)  cefTRIAXone (ROCEPHIN) 1 g in sodium chloride 0.9 % 100 mL IVPB (0 g Intravenous Stopped 07/09/22 1201)    ED Course/ Medical Decision  Making/ A&P                           Medical Decision Making Amount and/or Complexity of Data Reviewed Labs: ordered. Radiology: ordered.  Risk Decision regarding hospitalization.   CRITICAL CARE Performed by: Vanetta Mulders Total critical care time: 40 minutes Critical care time was exclusive of separately billable procedures and treating other patients. Critical care was necessary to treat or prevent imminent or life-threatening deterioration. Critical care was time spent personally by me on the following activities: development of treatment plan with patient and/or surrogate as well as nursing, discussions with consultants, evaluation of patient's response to treatment, examination of patient, obtaining history from patient or surrogate, ordering and performing treatments and interventions, ordering and review of laboratory studies, ordering and review of radiographic studies, pulse oximetry and re-evaluation of patient's condition.  Patient here was taken off oxygen after being placed on some oxygen when she first brought back.  Patient sats would go down to about 91%.  Ambulated her and sats initially did okay but when they were getting her back in the bed without any coughing episodes she desatted down to 87 had a take several deep breaths to get it back up.  Chest x-ray consistent with a multifocal pneumonia.  Also urinalysis is nitrite positive and large leukocytes suggestive of urinary tract infection  urine culture sent.  Patient treated with Rocephin and azithromycin for the pneumonia.  This this initially will cover the urinary tract infection.  Blood cultures are pending urine culture pending complete metabolic panel LFTs are normal renal function normal no significant electrolyte abnormalities.  COVID influenza testing negative.  Regular chest x-ray consistent with patchy infiltrates in the mid lungs and at the left base consistent with pneumonia.  I think this explains the patient's symptoms.  And her hypoxia.  Patient talking well otherwise nontoxic no acute distress.    Final Clinical Impression(s) / ED Diagnoses Final diagnoses:  Multifocal pneumonia  Acute cystitis without hematuria  Hypoxia    Rx / DC Orders ED Discharge Orders     None         Vanetta Mulders, MD 07/09/22 1229

## 2022-07-09 NOTE — ED Triage Notes (Signed)
C/o cough x 1 week. States the past few days hasn't had an appetite. Been vomiting whenever she tries to eat/drink. Last BM this morning, loose in nature. Also c/o heartburn and chest tightness.  Hx of stomach volvulus in 2020.

## 2022-07-09 NOTE — Progress Notes (Signed)
Patient ambulated with pulse ox. SAT 90-91% walking, but dropped to 87% once tucked in. MD aware

## 2022-07-09 NOTE — ED Notes (Signed)
Called [facility] to consult hospitalist

## 2022-07-09 NOTE — Progress Notes (Signed)
Plan of Care Note for accepted transfer   Patient: Elizabeth Banks MRN: [id number]   DOA: 07/09/2022  Facility requesting transfer: [facility] Requesting Provider: Dr. Deretha Emory Reason for transfer: PNA Facility course: 76 yo F w/ PMHx of asthma, GERD, hypothyroidism. Presenting with 1 week of cough, shortness of breath. W/u revealed elevated WBC and multifocal PNA on CXR. She is COVID/flu negative. She was started on rocephin, azithro.   Plan of care: The patient is accepted for admission to Med-surg  unit, at [facility]..  While holding at [facility], medical decision making responsibilities remain with the EDP. Upon arrival to [facility], [facility] will assume care. Thank you.   Author: Teddy Spike, DO 07/09/2022  Check [email or web address] for on-call coverage.  Nursing staff, Please call [facility] Admits & Consults System-Wide number on Amion as soon as patient's arrival, so appropriate admitting provider can evaluate the pt.

## 2022-07-10 DIAGNOSIS — N390 Urinary tract infection, site not specified: Secondary | ICD-10-CM | POA: Insufficient documentation

## 2022-07-10 DIAGNOSIS — K219 Gastro-esophageal reflux disease without esophagitis: Secondary | ICD-10-CM | POA: Insufficient documentation

## 2022-07-10 DIAGNOSIS — F1011 Alcohol abuse, in remission: Secondary | ICD-10-CM | POA: Insufficient documentation

## 2022-07-10 DIAGNOSIS — J9601 Acute respiratory failure with hypoxia: Secondary | ICD-10-CM | POA: Insufficient documentation

## 2022-07-10 LAB — URINE CULTURE

## 2022-07-10 LAB — BASIC METABOLIC PANEL
Anion gap: 9 (ref 5–15)
BUN: 9 mg/dL (ref 8–23)
CO2: 24 mmol/L (ref 22–32)
Calcium: 8.6 mg/dL — ABNORMAL LOW (ref 8.9–10.3)
Chloride: 105 mmol/L (ref 98–111)
Creatinine, Ser: 0.68 mg/dL (ref 0.44–1.00)
GFR, Estimated: >60 mL/min (ref >60)
Glucose, Bld: 123 mg/dL — ABNORMAL HIGH (ref 70–99)
Potassium: 3.7 mmol/L (ref 3.5–5.1)
Sodium: 138 mmol/L (ref 135–145)

## 2022-07-10 LAB — CBC
HCT: 38.5 % (ref 36.0–46.0)
Hemoglobin: 12.4 g/dL (ref 12.0–15.0)
MCH: 30.8 pg (ref 26.0–34.0)
MCHC: 32.2 g/dL (ref 30.0–36.0)
MCV: 95.5 fL (ref 80.0–100.0)
Platelets: 289 10*3/uL (ref 150–400)
RBC: 4.03 MIL/uL (ref 3.87–5.11)
RDW: 13.3 % (ref 11.5–15.5)
WBC: 12.2 10*3/uL — ABNORMAL HIGH (ref 4.0–10.5)
nRBC: 0 % (ref 0.0–0.2)

## 2022-07-10 LAB — BRAIN NATRIURETIC PEPTIDE: B Natriuretic Peptide: 50.4 pg/mL (ref 0.0–100.0)

## 2022-07-10 LAB — STREP PNEUMONIAE URINARY ANTIGEN: Strep Pneumo Urinary Antigen: NEGATIVE

## 2022-07-10 LAB — PROCALCITONIN: Procalcitonin: 0.13 ng/mL

## 2022-07-10 LAB — MRSA NEXT GEN BY PCR, NASAL: MRSA by PCR Next Gen: NOT DETECTED

## 2022-07-10 MED ORDER — HYDRALAZINE HCL 20 MG/ML IJ SOLN: 10.0000 mg | INTRAMUSCULAR | Status: DC | PRN

## 2022-07-10 MED ORDER — ENOXAPARIN SODIUM 40 MG/0.4ML IJ SOSY
40.0000 mg | PREFILLED_SYRINGE | INTRAMUSCULAR | Status: DC
  Administered 2022-07-10: 40 mg via SUBCUTANEOUS
  Filled 2022-07-10: qty 0.4

## 2022-07-10 MED ORDER — ACETAMINOPHEN 325 MG PO TABS: 650.0000 mg | ORAL_TABLET | Freq: Four times a day (QID) | ORAL | Status: DC | PRN

## 2022-07-10 MED ORDER — SODIUM CHLORIDE 0.9 % IV SOLN
500.0000 mg | INTRAVENOUS | Status: DC
  Administered 2022-07-10: 500 mg via INTRAVENOUS
  Filled 2022-07-10 (×2): qty 5

## 2022-07-10 MED ORDER — VITAMIN D 25 MCG (1000 UNIT) PO TABS
1000.0000 [IU] | ORAL_TABLET | Freq: Every day | ORAL | Status: DC
  Administered 2022-07-10: 1000 [IU] via ORAL
  Filled 2022-07-10: qty 1

## 2022-07-10 MED ORDER — FAMOTIDINE 20 MG PO TABS
40.0000 mg | ORAL_TABLET | Freq: Every day | ORAL | Status: DC
  Administered 2022-07-10: 40 mg via ORAL
  Filled 2022-07-10 (×2): qty 2

## 2022-07-10 MED ORDER — LEVOTHYROXINE SODIUM 88 MCG PO TABS
88.0000 ug | ORAL_TABLET | Freq: Every day | ORAL | Status: DC
  Administered 2022-07-10: 88 ug via ORAL
  Filled 2022-07-10: qty 1

## 2022-07-10 MED ORDER — SODIUM CHLORIDE 0.9 % IV SOLN
1.0000 g | INTRAVENOUS | Status: DC
  Administered 2022-07-10: 1 g via INTRAVENOUS
  Filled 2022-07-10: qty 10

## 2022-07-10 MED ORDER — POLYVINYL ALCOHOL 1.4 % OP SOLN
1.0000 [drp] | OPHTHALMIC | Status: DC | PRN
  Administered 2022-07-10: 1 [drp] via OPHTHALMIC
  Filled 2022-07-10 (×2): qty 15

## 2022-07-10 MED ORDER — OXYCODONE HCL 5 MG PO TABS: 5.0000 mg | ORAL_TABLET | ORAL | Status: DC | PRN

## 2022-07-10 MED ORDER — PANTOPRAZOLE SODIUM 40 MG PO TBEC
40.0000 mg | DELAYED_RELEASE_TABLET | Freq: Every day | ORAL | Status: DC
  Administered 2022-07-10: 40 mg via ORAL
  Filled 2022-07-10: qty 1

## 2022-07-10 MED ORDER — IPRATROPIUM-ALBUTEROL 0.5-2.5 (3) MG/3ML IN SOLN
3.0000 mL | RESPIRATORY_TRACT | Status: DC | PRN
  Administered 2022-07-10 – 2022-07-11 (×3): 3 mL via RESPIRATORY_TRACT
  Filled 2022-07-10 (×3): qty 3

## 2022-07-10 MED ORDER — ACETAMINOPHEN 650 MG RE SUPP: 650.0000 mg | Freq: Four times a day (QID) | RECTAL | Status: DC | PRN

## 2022-07-10 MED ORDER — SENNOSIDES-DOCUSATE SODIUM 8.6-50 MG PO TABS: 1.0000 | ORAL_TABLET | Freq: Every evening | ORAL | Status: DC | PRN

## 2022-07-10 MED ORDER — ONDANSETRON HCL 4 MG/2ML IJ SOLN: 4.0000 mg | Freq: Four times a day (QID) | INTRAMUSCULAR | Status: DC | PRN

## 2022-07-10 MED ORDER — GUAIFENESIN 100 MG/5ML PO LIQD: 5.0000 mL | ORAL | Status: DC | PRN

## 2022-07-10 MED ORDER — METOPROLOL TARTRATE 5 MG/5ML IV SOLN: 5.0000 mg | INTRAVENOUS | Status: DC | PRN

## 2022-07-10 NOTE — TOC Progression Note (Signed)
Transition of Care [facility]) - Progression Note    Patient Details  Name: Elizabeth Banks MRN: [id number] Date of Birth: 1946/07/22  Transition of Care [facility]) CM/SW Contact  Coralyn Helling, [place] Phone Number: 07/10/2022, 10:08 AM  Clinical Narrative:     Transition of Care (TOC) Screening Note   Patient Details  Name: Elizabeth Banks Date of Birth: [birth year]   Transition of Care [facility]) CM/SW Contact:    Coralyn Helling, LCSW Phone Number: 07/10/2022, 10:08 AM    Transition of Care Department [facility]) has reviewed patient and no TOC needs have been identified at this time. We will continue to monitor patient advancement through interdisciplinary progression rounds. If new patient transition needs arise, please place a TOC consult.          Expected Discharge Plan and Services                                                 Social Determinants of Health (SDOH) Interventions    Readmission Risk Interventions     No data to display

## 2022-07-10 NOTE — H&P (Signed)
History and Physical    Elizabeth Banks [id number] DOB: 01/22/1946 DOA: 07/09/2022  PCP: Patient, No Pcp Per  Patient coming from: [facility]  Chief Complaint: Cough  HPI: Elizabeth Banks is a 76 y.o. female with medical history significant of alcohol abuse, asthma, GERD, hypothyroidism, history of paraesophageal hernia and gastric volvulus presented to the ED with complaints of cough, shortness of breath, vomiting, and diarrhea.  Desatted to 87% with ambulation and placed on 2 L O2.  Afebrile.  Labs showing WBC 12.0, sodium 134, lipase and LFTs normal, SARS-CoV-2 PCR and influenza panel negative, lactate normal.  UA with positive nitrite, large amount of leukocytes, and microscopy showing 6-10 WBCs and rare bacteria.  Urine culture pending.  Blood cultures drawn.  Chest x-ray showing patchy infiltrates in the mid lungs and left lung base consistent with multifocal pneumonia. Patient was given ceftriaxone and azithromycin.  [facility]  Patient reports 1 week history of cough, dyspnea on exertion, fatigue, and poor appetite.  She denies fevers.  Reports a brief episode of substernal chest pain at rest which occurred 2 days ago just after she drank bone broth.  She felt that the pain was due to acid reflux and drank ginger ale to help but had an episode of vomiting soon after which her chest discomfort resolved and she felt better.  Denies any recurrence of vomiting or chest pain/discomfort since then.  About 2 or 3 days ago she had 1 episode of diarrhea which has resolved.  Denies abdominal pain or urinary symptoms.  She reports history of mild asthma for which she uses Breo inhaler daily and has not required albuterol for several years.  She reports history of alcohol use in the past but now drinks only on occasion.  Review of Systems:  Review of Systems  All other systems reviewed and are negative.   Past Medical History:  Diagnosis Date   Asthma    Hypothyroidism     Thyroid disease     Past Surgical History:  Procedure Laterality Date   ABDOMINAL HYSTERECTOMY     LAPAROSCOPIC INSERTION GASTROSTOMY TUBE N/A 09/10/2018   Procedure: LAPAROSCOPIC INSERTION GASTROSTOMY TUBE;  Surgeon: Sheliah HatchKinsinger, De BlanchLuke Aaron, MD;  Location: [facility] OR;  Service: General;  Laterality: N/A;   NASAL POLYP SURGERY  2001     reports that she has never smoked. She has never used smokeless tobacco. She reports current alcohol use. She reports that she does not use drugs.  Allergies  Allergen Reactions   Morphine And Related Other (See Comments)    hallucinations   Other     "some pain pill"   Penicillins Hives    Did it involve swelling of the face/tongue/throat, SOB, or low BP? Unknown Did it involve sudden or severe rash/hives, skin peeling, or any reaction on the inside of your mouth or nose? Unknown Did you need to seek medical attention at a hospital or doctor's office? Unknown When did it last happen? About 30 years ago       If all above answers are "NO", may proceed with cephalosporin use.     Family History  Problem Relation Age of Onset   Leukemia Mother    Breast cancer Mother    Prostate cancer Brother     Prior to Admission medications   Medication Sig Start Date End Date Taking? Authorizing Provider  acetaminophen (TYLENOL) 325 MG tablet Take 2 tablets (650 mg total) by mouth every 6 (six) hours as needed for  mild pain or fever. 09/12/18   Shon Hale, MD  b complex vitamins capsule Take 1 capsule by mouth daily.    [provider]  budesonide (PULMICORT) 0.5 MG/2ML nebulizer solution Take 0.5 mg by nebulization daily.    [provider]  cholecalciferol (VITAMIN D3) 25 MCG (1000 UT) tablet Take 1,000 Units by mouth daily.    [provider]  fluticasone furoate-vilanterol (BREO ELLIPTA) 100-25 MCG/INH AEPB Inhale 1 puff into the lungs daily.     [provider]  folic acid (FOLVITE) 1 MG tablet Take 1 tablet (1 mg total)  by mouth daily. 09/13/18   Emokpae, Courage, MD  IRON PO Take 150 mg by mouth daily.     [provider]  levothyroxine (SYNTHROID, LEVOTHROID) 100 MCG tablet Take 100 mcg by mouth daily before breakfast.     [provider]  Magnesium 200 MG TABS Take 200 mg by mouth daily.    [provider]  ondansetron (ZOFRAN ODT) 4 MG disintegrating tablet Take 1 tablet (4 mg total) by mouth every 8 (eight) hours as needed for nausea or vomiting. 09/12/18   Shon Hale, MD  pantoprazole (PROTONIX) 40 MG tablet Take 40 mg by mouth daily.     [provider]    Physical Exam: Vitals:   07/09/22 2232 07/09/22 2329 07/09/22 2333 07/10/22 0340  BP: (!) 146/68 (!) 159/67  (!) 146/69  Pulse: 88 87  89  Resp: 15 16  16   Temp:  99 F (37.2 C)  99.9 F (37.7 C)  TempSrc:  Oral  Oral  SpO2: 93% 94%  98%  Weight:   63.4 kg   Height:   4\' 11"  (1.499 m)     Physical Exam Vitals reviewed.  Constitutional:      General: She is not in acute distress. HENT:     Head: Normocephalic and atraumatic.  Eyes:     Extraocular Movements: Extraocular movements intact.  Cardiovascular:     Rate and Rhythm: Normal rate and regular rhythm.     Pulses: Normal pulses.  Pulmonary:     Effort: Pulmonary effort is normal. No respiratory distress.     Breath sounds: No wheezing or rales.  Abdominal:     General: Bowel sounds are normal. There is no distension.     Palpations: Abdomen is soft.     Tenderness: There is no abdominal tenderness.  Musculoskeletal:        General: No swelling or tenderness.     Cervical back: Normal range of motion.  Skin:    General: Skin is warm and dry.  Neurological:     General: No focal deficit present.     Mental Status: She is alert and oriented to person, place, and time.     Labs on Admission: I have personally reviewed following labs and imaging studies  CBC: Recent Labs  Lab 07/09/22 0906  WBC 12.0*  HGB 12.2  HCT 36.2  MCV  91.9  PLT 250   Basic Metabolic Panel: Recent Labs  Lab 07/09/22 0906  NA 134*  K 3.6  CL 99  CO2 26  GLUCOSE 119*  BUN 12  CREATININE 0.86  CALCIUM 8.4*   GFR: Estimated Creatinine Clearance: 43.7 mL/min (by C-G formula based on SCr of 0.86 mg/dL). Liver Function Tests: Recent Labs  Lab 07/09/22 0906  AST 26  ALT 16  ALKPHOS 62  BILITOT 0.8  PROT 7.1  ALBUMIN 3.2*   Recent Labs  Lab 07/09/22 0906  LIPASE 27   No results for input(s): "AMMONIA" in the last 168 hours. Coagulation Profile: No results for input(s): "INR", "PROTIME" in the last 168 hours. Cardiac Enzymes: No results for input(s): "CKTOTAL", "CKMB", "CKMBINDEX", "TROPONINI" in the last 168 hours. BNP (last 3 results) No results for input(s): "PROBNP" in the last 8760 hours. HbA1C: No results for input(s): "HGBA1C" in the last 72 hours. CBG: No results for input(s): "GLUCAP" in the last 168 hours. Lipid Profile: No results for input(s): "CHOL", "HDL", "LDLCALC", "TRIG", "CHOLHDL", "LDLDIRECT" in the last 72 hours. Thyroid Function Tests: No results for input(s): "TSH", "T4TOTAL", "FREET4", "T3FREE", "THYROIDAB" in the last 72 hours. Anemia Panel: No results for input(s): "VITAMINB12", "FOLATE", "FERRITIN", "TIBC", "IRON", "RETICCTPCT" in the last 72 hours. Urine analysis:    Component Value Date/Time   COLORURINE AMBER (A) 07/09/2022 0911   APPEARANCEUR HAZY (A) 07/09/2022 0911   LABSPEC 1.025 07/09/2022 0911   PHURINE 5.5 07/09/2022 0911   GLUCOSEU NEGATIVE 07/09/2022 0911   HGBUR TRACE (A) 07/09/2022 0911   BILIRUBINUR SMALL (A) 07/09/2022 0911   KETONESUR NEGATIVE 07/09/2022 0911   PROTEINUR 100 (A) 07/09/2022 0911   NITRITE POSITIVE (A) 07/09/2022 0911   LEUKOCYTESUR LARGE (A) 07/09/2022 0911    Radiological Exams on Admission: DG Chest 2 View  Result Date: 07/09/2022 CLINICAL DATA:  Cough for 1 week, anorexia, vomiting EXAM: CHEST - 2 VIEW COMPARISON:  09/07/2018 FINDINGS: Normal  heart size, mediastinal contours, and pulmonary vascularity. Patchy infiltrates at mid lungs and LEFT base favoring multifocal pneumonia. No pleural effusion or pneumothorax. Osseous structures unremarkable. Large hiatal hernia seen on previous exam no longer identified. IMPRESSION: Patchy infiltrates in the mid lungs and at LEFT base consistent with pneumonia. Electronically Signed   By: Ulyses Southward M.D.   On: 07/09/2022 09:21    EKG: Independently reviewed.  Sinus rhythm, no significant change since prior tracing.  Assessment and Plan  Acute hypoxemic respiratory failure secondary to multifocal pneumonia Desatted to 87% with ambulation in the ED and was placed on 2 L O2, currently stable.  WBC 12.0, does not meet any other SIRS criteria at this time.  No lactic acidosis or hypotension.  SARS-CoV-2 PCR and influenza panel negative. Chest x-ray showing patchy infiltrates in the mid lungs and left lung base consistent with multifocal pneumonia. -Continue ceftriaxone and azithromycin -Blood cultures pending -Strep pneumo and Legionella urinary antigens -MRSA PCR screen -Continue supplemental oxygen, wean as tolerated  Possible UTI Patient denies urinary symptoms.  UA with positive nitrite, large amount of leukocytes, and microscopy showing 6-10 WBCs and rare bacteria.  Has mild leukocytosis but does not meet any other SIRS criteria at this time.  No lactic acidosis or hypotension. -Continue ceftriaxone -Urine culture pending  History of alcohol abuse Patient denies ongoing alcohol abuse.  No signs of withdrawal. -CIWA monitoring  Asthma: Stable, no wheezing. GERD Hypothyroidism -Pharmacy med rec pending.  DVT prophylaxis: Lovenox Code Status: Full Code (discussed with the patient) Family Communication: No family available at this time. Level of care: Med-Surg Admission status: It is my clinical opinion that referral for OBSERVATION is reasonable and necessary in this patient based on  the above information provided. The aforementioned taken together are felt to place the patient at high risk for further clinical deterioration. However, it is anticipated that the patient may be medically stable for discharge from the hospital within 24 to 48 hours.   John Giovanni MD [facility]  If 7PM-7AM, please contact  night-coverage [email or web address]  07/10/2022, 4:48 AM

## 2022-07-10 NOTE — Progress Notes (Signed)
Mobility Specialist - Progress Note   07/10/22 1100  Mobility  Activity Ambulated with assistance in hallway  Level of Assistance Independent after set-up  Assistive Device None  Distance Ambulated (ft) 500 ft  Activity Response Tolerated well  Mobility Referral Yes  $Mobility charge 1 Mobility   Nurse requested Mobility Specialist to perform oxygen saturation test with pt which includes removing pt from oxygen both at rest and while ambulating.  Below are the results from that testing.     Patient Saturations on Room Air at Rest = spO2 95% Patient Saturations on Room Air while Ambulating = sp02 94% .   At end of testing pt left in room on 0 Liters of oxygen per nurse request.  Reported results to nurse.    Pt received in bed and agreed to mobility. No c/o pain nor discomfort during session. Pt returned to chair with all needs met.   Roderick Pee Mobility Specialist

## 2022-07-10 NOTE — Progress Notes (Signed)
PROGRESS NOTE    Elizabeth Banks  VXY:[id number] DOB: 12-08-1945 DOA: 07/09/2022 PCP: Patient, No Pcp Per   Brief Narrative:  76 year old with history of allergies, asthma, hypothyroid is him, paraesophageal hernia, gastric volvulus comes to the ED with complaints of cough, shortness of breath, vomiting and diarrhea.  Chest x-ray showed concerns of patchy infiltrate/multifocal pneumonia started on Rocephin and azithromycin.  UA showed concerns of possible UTI as well.   Assessment & Plan:  Principal Problem:   Multifocal pneumonia Active Problems:   Asthma   Hypothyroidism   Acute hypoxemic respiratory failure (HCC)   UTI (urinary tract infection)   History of alcohol abuse   GERD (gastroesophageal reflux disease)     Acute hypoxemic respiratory failure secondary to multifocal pneumonia Hypoxia in the ER saturating 87% therefore placed on 2 L nasal cannula.  COVID/flu negative.  Chest x-ray shows multifocal infiltrate concerning for multifocal pneumonia.  Currently on Rocephin and azithromycin.  Low threshold to stop it over next 24 hours - Check BNP and procalcitonin - Strep pneumo and Legionella antigens - Bronchodilators scheduled and as needed, I-S/flutter valve   Possible UTI Urine culture is pending.  Already on IV Rocephin   History of alcohol abuse Patient denies ongoing alcohol abuse.  No signs of withdrawal. -CIWA monitoring   Asthma: Stable, no wheezing. GERD: Resume home PPI Hypothyroidism: Synthroid    DVT prophylaxis: Lovenox Code Status: Full code Family Communication:    Maintain hospital stay to wean her off oxygen.  Still has abnormal breath sounds.  Hopefully home in next 24-48 hours   Subjective: Received breathing treatment this morning which helped her.  Feels quite congested but failed after getting breathing treatment her airway felt little bit more open.   Examination:  Constitutional: Not in acute distress Respiratory: Bilateral  rhonchi Cardiovascular: Normal sinus rhythm, no rubs Abdomen: Nontender nondistended good bowel sounds Musculoskeletal: No edema noted Skin: No rashes seen Neurologic: CN 2-12 grossly intact.  And nonfocal Psychiatric: Normal judgment and insight. Alert and oriented x 3. Normal mood.      Objective: Vitals:   07/09/22 2329 07/09/22 2333 07/10/22 0340 07/10/22 0735  BP: (!) 159/67  (!) 146/69 (!) 142/68  Pulse: 87  89 83  Resp: 16  16 18   Temp: 99 F (37.2 C)  99.9 F (37.7 C) 98.6 F (37 C)  TempSrc: Oral  Oral Oral  SpO2: 94%  98% 95%  Weight:  63.4 kg    Height:  4\' 11"  (1.499 m)      Intake/Output Summary (Last 24 hours) at 07/10/2022 0816 Last data filed at 07/10/2022 14/02/2022 Gross per 24 hour  Intake 491.47 ml  Output 550 ml  Net -58.53 ml   Filed Weights   07/09/22 0855 07/09/22 2333  Weight: 61.7 kg 63.4 kg     Data Reviewed:   CBC: Recent Labs  Lab 07/09/22 0906 07/10/22 0628  WBC 12.0* 12.2*  HGB 12.2 12.4  HCT 36.2 38.5  MCV 91.9 95.5  PLT 250 289   Basic Metabolic Panel: Recent Labs  Lab 07/09/22 0906 07/10/22 0628  NA 134* 138  K 3.6 3.7  CL 99 105  CO2 26 24  GLUCOSE 119* 123*  BUN 12 9  CREATININE 0.86 0.68  CALCIUM 8.4* 8.6*   GFR: Estimated Creatinine Clearance: 46.9 mL/min (by C-G formula based on SCr of 0.68 mg/dL). Liver Function Tests: Recent Labs  Lab 07/09/22 0906  AST 26  ALT 16  ALKPHOS 62  BILITOT  0.8  PROT 7.1  ALBUMIN 3.2*   Recent Labs  Lab 07/09/22 0906  LIPASE 27   No results for input(s): "AMMONIA" in the last 168 hours. Coagulation Profile: No results for input(s): "INR", "PROTIME" in the last 168 hours. Cardiac Enzymes: No results for input(s): "CKTOTAL", "CKMB", "CKMBINDEX", "TROPONINI" in the last 168 hours. BNP (last 3 results) No results for input(s): "PROBNP" in the last 8760 hours. HbA1C: No results for input(s): "HGBA1C" in the last 72 hours. CBG: No results for input(s): "GLUCAP" in the  last 168 hours. Lipid Profile: No results for input(s): "CHOL", "HDL", "LDLCALC", "TRIG", "CHOLHDL", "LDLDIRECT" in the last 72 hours. Thyroid Function Tests: No results for input(s): "TSH", "T4TOTAL", "FREET4", "T3FREE", "THYROIDAB" in the last 72 hours. Anemia Panel: No results for input(s): "VITAMINB12", "FOLATE", "FERRITIN", "TIBC", "IRON", "RETICCTPCT" in the last 72 hours. Sepsis Labs: Recent Labs  Lab 07/09/22 1321  LATICACIDVEN 1.0    Recent Results (from the past 240 hour(s))  Resp Panel by RT-PCR (Flu A&B, Covid) Anterior Nasal Swab     Status: None   Collection Time: 07/09/22  9:06 AM   Specimen: Anterior Nasal Swab  Result Value Ref Range Status   SARS Coronavirus 2 by RT PCR NEGATIVE NEGATIVE Final    Comment: (NOTE) SARS-CoV-2 target nucleic acids are NOT DETECTED.  The SARS-CoV-2 RNA is generally detectable in upper respiratory specimens during the acute phase of infection. The lowest concentration of SARS-CoV-2 viral copies this assay can detect is 138 copies/mL. A negative result does not preclude SARS-Cov-2 infection and should not be used as the sole basis for treatment or other patient management decisions. A negative result may occur with  improper specimen collection/handling, submission of specimen other than nasopharyngeal swab, presence of viral mutation(s) within the areas targeted by this assay, and inadequate number of viral copies(<138 copies/mL). A negative result must be combined with clinical observations, patient history, and epidemiological information. The expected result is Negative.  Fact Sheet for Patients:  BloggerCourse.com  Fact Sheet for Healthcare Providers:  SeriousBroker.it  This test is no t yet approved or cleared by the [place] FDA and  has been authorized for detection and/or diagnosis of SARS-CoV-2 by FDA under an Emergency Use Authorization (EUA). This EUA will remain   in effect (meaning this test can be used) for the duration of the COVID-19 declaration under Section 564(b)(1) of the Act, 21 U.S.C.section 360bbb-3(b)(1), unless the authorization is terminated  or revoked sooner.       Influenza A by PCR NEGATIVE NEGATIVE Final   Influenza B by PCR NEGATIVE NEGATIVE Final    Comment: (NOTE) The Xpert Xpress SARS-CoV-2/FLU/RSV plus assay is intended as an aid in the diagnosis of influenza from Nasopharyngeal swab specimens and should not be used as a sole basis for treatment. Nasal washings and aspirates are unacceptable for Xpert Xpress SARS-CoV-2/FLU/RSV testing.  Fact Sheet for Patients: BloggerCourse.com  Fact Sheet for Healthcare Providers: SeriousBroker.it  This test is not yet approved or cleared by the [place] FDA and has been authorized for detection and/or diagnosis of SARS-CoV-2 by FDA under an Emergency Use Authorization (EUA). This EUA will remain in effect (meaning this test can be used) for the duration of the COVID-19 declaration under Section 564(b)(1) of the Act, 21 U.S.C. section 360bbb-3(b)(1), unless the authorization is terminated or revoked.  Performed at [facility], [address] [place], [place] [address]   MRSA Next Gen by PCR, Nasal  Status: None   Collection Time: 07/10/22  5:48 AM   Specimen: Nasal Mucosa; Nasal Swab  Result Value Ref Range Status   MRSA by PCR Next Gen NOT DETECTED NOT DETECTED Final    Comment: (NOTE) The GeneXpert MRSA Assay (FDA approved for NASAL specimens only), is one component of a comprehensive MRSA colonization surveillance program. It is not intended to diagnose MRSA infection nor to guide or monitor treatment for MRSA infections. Test performance is not FDA approved in patients less than 57 years old. Performed at [facility], [address], [place], [place] [address]           Radiology Studies: DG Chest 2 View  Result Date: 07/09/2022 CLINICAL DATA:  Cough for 1 week, anorexia, vomiting EXAM: CHEST - 2 VIEW COMPARISON:  09/07/2018 FINDINGS: Normal heart size, mediastinal contours, and pulmonary vascularity. Patchy infiltrates at mid lungs and LEFT base favoring multifocal pneumonia. No pleural effusion or pneumothorax. Osseous structures unremarkable. Large hiatal hernia seen on previous exam no longer identified. IMPRESSION: Patchy infiltrates in the mid lungs and at LEFT base consistent with pneumonia. Electronically Signed   By: Ulyses Southward M.D.   On: 07/09/2022 09:21        Scheduled Meds:  enoxaparin (LOVENOX) injection  40 mg Subcutaneous Q24H   Continuous Infusions:  azithromycin     cefTRIAXone (ROCEPHIN)  IV       LOS: 0 days   Time spent= 35 mins    Nellie Pester Joline Maxcy, MD [facility]  If 7PM-7AM, please contact night-coverage  07/10/2022, 8:16 AM

## 2022-07-11 ENCOUNTER — Other Ambulatory Visit (HOSPITAL_COMMUNITY): Payer: Self-pay

## 2022-07-11 LAB — RESPIRATORY PANEL BY PCR

## 2022-07-11 LAB — BASIC METABOLIC PANEL
Anion gap: 8 (ref 5–15)
BUN: 8 mg/dL (ref 8–23)
CO2: 24 mmol/L (ref 22–32)
Calcium: 7.9 mg/dL — ABNORMAL LOW (ref 8.9–10.3)
Chloride: 101 mmol/L (ref 98–111)
Creatinine, Ser: 0.73 mg/dL (ref 0.44–1.00)
GFR, Estimated: >60 mL/min (ref >60)
Glucose, Bld: 102 mg/dL — ABNORMAL HIGH (ref 70–99)
Potassium: 3.5 mmol/L (ref 3.5–5.1)
Sodium: 133 mmol/L — ABNORMAL LOW (ref 135–145)

## 2022-07-11 LAB — CBC
HCT: 33 % — ABNORMAL LOW (ref 36.0–46.0)
Hemoglobin: 10.6 g/dL — ABNORMAL LOW (ref 12.0–15.0)
MCH: 30.8 pg (ref 26.0–34.0)
MCHC: 32.1 g/dL (ref 30.0–36.0)
MCV: 95.9 fL (ref 80.0–100.0)
Platelets: 283 10*3/uL (ref 150–400)
RBC: 3.44 MIL/uL — ABNORMAL LOW (ref 3.87–5.11)
RDW: 13.4 % (ref 11.5–15.5)
WBC: 9.7 10*3/uL (ref 4.0–10.5)
nRBC: 0.2 % (ref 0.0–0.2)

## 2022-07-11 LAB — LEGIONELLA PNEUMOPHILA SEROGP 1 UR AG: L. pneumophila Serogp 1 Ur Ag: NEGATIVE

## 2022-07-11 LAB — MAGNESIUM: Magnesium: 2.1 mg/dL (ref 1.7–2.4)

## 2022-07-11 MED ORDER — DOXYCYCLINE MONOHYDRATE 100 MG PO TABS
100.0000 mg | ORAL_TABLET | Freq: Two times a day (BID) | ORAL | 0 refills | Status: AC
  Filled 2022-07-11: qty 12, 6d supply, fill #0

## 2022-07-11 MED ORDER — CEPHALEXIN 500 MG PO CAPS
500.0000 mg | ORAL_CAPSULE | Freq: Three times a day (TID) | ORAL | 0 refills | Status: AC
  Filled 2022-07-11: qty 9, 3d supply, fill #0

## 2022-07-11 MED ORDER — ALBUTEROL SULFATE HFA 108 (90 BASE) MCG/ACT IN AERS
1.0000 | INHALATION_SPRAY | Freq: Four times a day (QID) | RESPIRATORY_TRACT | 0 refills | Status: AC | PRN
  Filled 2022-07-11: qty 6.7, 25d supply, fill #0

## 2022-07-11 NOTE — Discharge Summary (Signed)
Physician Discharge Summary  Exie Chrismer XTG:[id number] DOB: 06/25/1946 DOA: 07/09/2022  PCP: Patient, No Pcp Per  Admit date: 07/09/2022 Discharge date: 07/11/2022  Admitted From: Home Disposition: Home  Recommendations for Outpatient Follow-up:  Follow up with PCP in 1-2 weeks Please obtain BMP/CBC in one week your next doctors visit.  P.o. doxycycline given for pneumonia/bronchitis P.o. Keflex for 3 days for possible mild urinary tract infection Albuterol rescue inhaler prescribed  Home Health: None Equipment/Devices: Incentive spirometer Discharge Condition: Stable CODE STATUS: Full code Diet recommendation: Regular  Brief/Interim Summary: 76 year old with history of allergies, asthma, hypothyroid is him, paraesophageal hernia, gastric volvulus comes to the ED with complaints of cough, shortness of breath, vomiting and diarrhea.  Chest x-ray showed concerns of patchy infiltrate/multifocal pneumonia started on Rocephin and azithromycin.  UA showed concerns of possible UTI as well.  During the hospitalization respiratory panel, BNP were negative, procalcitonin was 0.13.  There is concerns of mild bronchitis therefore will give doxycycline upon discharge.  She is also being given albuterol inhaler and Keflex as she is having mild urinary symptoms. Today she feels much better therefore we will discharge her in stable condition     Assessment & Plan:  Principal Problem:   Multifocal pneumonia Active Problems:   Asthma   Hypothyroidism   Acute hypoxemic respiratory failure (HCC)   UTI (urinary tract infection)   History of alcohol abuse   GERD (gastroesophageal reflux disease)      Acute hypoxemic respiratory failure secondary to multifocal pneumonia Hypoxia in the ER saturating 87% therefore placed on 2 L nasal cannula.  Hypoxia has resolved.  COVID/flu negative.  Chest x-ray shows multifocal infiltrate concerning for multifocal pneumonia.  Patient's BNP is 15, Pro-Cal 0.13,  respiratory panel is negative.  Symptoms are much better on room air ambulating well.  Will discharge on doxycycline and give her prescription for rescue inhaler   Possible UTI Will give her 3 more days of oral Keflex   History of alcohol abuse Patient denies ongoing alcohol abuse.  No signs of withdrawal. -CIWA monitoring   Asthma: Stable, no wheezing. GERD: Resume home PPI Hypothyroidism: Synthroid        Consultations: None  Subjective: Feels great no complaints.  She wishes to go home.  Discharge Exam: Vitals:   07/11/22 0644 07/11/22 0833  BP: 134/64   Pulse: 81   Resp: 16   Temp: 98.9 F (37.2 C)   SpO2: 92% 92%   Vitals:   07/10/22 1423 07/10/22 2010 07/11/22 0644 07/11/22 0833  BP: (!) 140/70 (!) 156/69 134/64   Pulse: 84 83 81   Resp: 18 16 16    Temp: 98.9 F (37.2 C) (!) 100.8 F (38.2 C) 98.9 F (37.2 C)   TempSrc: Oral Oral Oral   SpO2: 95% 94% 92% 92%  Weight:      Height:        General: Pt is alert, awake, not in acute distress Cardiovascular: RRR, S1/S2 +, no rubs, no gallops Respiratory: Very minimal bilateral rhonchi Abdominal: Soft, NT, ND, bowel sounds + Extremities: no edema, no cyanosis  Discharge Instructions   Allergies as of 07/11/2022       Reactions   Morphine And Related Other (See Comments)   hallucinations   Other    "some pain pill"   Penicillins Hives   Did it involve swelling of the face/tongue/throat, SOB, or low BP? Unknown Did it involve sudden or severe rash/hives, skin peeling, or any reaction on the inside of  your mouth or nose? Unknown Did you need to seek medical attention at a hospital or doctor's office? Unknown When did it last happen? About 30 years ago       If all above answers are "NO", may proceed with cephalosporin use.        Medication List     TAKE these medications    acetaminophen 325 MG tablet Commonly known as: TYLENOL Take 2 tablets (650 mg total) by mouth every 6 (six) hours as  needed for mild pain or fever.   albuterol 108 (90 Base) MCG/ACT inhaler Commonly known as: VENTOLIN HFA Inhale 1-2 puffs into the lungs every 6 (six) hours as needed for wheezing or shortness of breath.   budesonide 0.5 MG/2ML nebulizer solution Commonly known as: PULMICORT Take 0.5 mg by nebulization daily.   carboxymethylcellulose 0.5 % Soln Commonly known as: REFRESH PLUS 1 drop daily as needed (dry eyes).   cephALEXin 500 MG capsule Commonly known as: KEFLEX Take 1 capsule (500 mg total) by mouth 3 (three) times daily for 3 days.   cholecalciferol 25 MCG (1000 UNIT) tablet Commonly known as: VITAMIN D3 Take 1,000 Units by mouth daily.   doxycycline 100 MG tablet Commonly known as: ADOXA Take 1 tablet (100 mg total) by mouth 2 (two) times daily for 6 days.   famotidine 40 MG tablet Commonly known as: PEPCID Take 40 mg by mouth at bedtime.   fluticasone furoate-vilanterol 200-25 MCG/ACT Aepb Commonly known as: BREO ELLIPTA Inhale 1 puff into the lungs daily.   ibuprofen 200 MG tablet Commonly known as: ADVIL Take 400 mg by mouth every 6 (six) hours as needed for mild pain.   levothyroxine 88 MCG tablet Commonly known as: SYNTHROID Take 88 mcg by mouth daily before breakfast.   pantoprazole 40 MG tablet Commonly known as: PROTONIX Take 40 mg by mouth daily.        Allergies  Allergen Reactions   Morphine And Related Other (See Comments)    hallucinations   Other     "some pain pill"   Penicillins Hives    Did it involve swelling of the face/tongue/throat, SOB, or low BP? Unknown Did it involve sudden or severe rash/hives, skin peeling, or any reaction on the inside of your mouth or nose? Unknown Did you need to seek medical attention at a hospital or doctor's office? Unknown When did it last happen? About 30 years ago       If all above answers are "NO", may proceed with cephalosporin use.     You were cared for by a hospitalist during your hospital  stay. If you have any questions about your discharge medications or the care you received while you were in the hospital after you are discharged, you can call the unit and asked to speak with the hospitalist on call if the hospitalist that took care of you is not available. Once you are discharged, your primary care physician will handle any further medical issues. Please note that no refills for any discharge medications will be authorized once you are discharged, as it is imperative that you return to your primary care physician (or establish a relationship with a primary care physician if you do not have one) for your aftercare needs so that they can reassess your need for medications and monitor your lab values.   Procedures/Studies: DG Chest 2 View  Result Date: 07/09/2022 CLINICAL DATA:  Cough for 1 week, anorexia, vomiting EXAM: CHEST - 2 VIEW COMPARISON:  09/07/2018 FINDINGS: Normal heart size, mediastinal contours, and pulmonary vascularity. Patchy infiltrates at mid lungs and LEFT base favoring multifocal pneumonia. No pleural effusion or pneumothorax. Osseous structures unremarkable. Large hiatal hernia seen on previous exam no longer identified. IMPRESSION: Patchy infiltrates in the mid lungs and at LEFT base consistent with pneumonia. Electronically Signed   By: Ulyses Southward M.D.   On: 07/09/2022 09:21     The results of significant diagnostics from this hospitalization (including imaging, microbiology, ancillary and laboratory) are listed below for reference.     Microbiology: Recent Results (from the past 240 hour(s))  Resp Panel by RT-PCR (Flu A&B, Covid) Anterior Nasal Swab     Status: None   Collection Time: 07/09/22  9:06 AM   Specimen: Anterior Nasal Swab  Result Value Ref Range Status   SARS Coronavirus 2 by RT PCR NEGATIVE NEGATIVE Final    Comment: (NOTE) SARS-CoV-2 target nucleic acids are NOT DETECTED.  The SARS-CoV-2 RNA is generally detectable in upper  respiratory specimens during the acute phase of infection. The lowest concentration of SARS-CoV-2 viral copies this assay can detect is 138 copies/mL. A negative result does not preclude SARS-Cov-2 infection and should not be used as the sole basis for treatment or other patient management decisions. A negative result may occur with  improper specimen collection/handling, submission of specimen other than nasopharyngeal swab, presence of viral mutation(s) within the areas targeted by this assay, and inadequate number of viral copies(<138 copies/mL). A negative result must be combined with clinical observations, patient history, and epidemiological information. The expected result is Negative.  Fact Sheet for Patients:  BloggerCourse.com  Fact Sheet for Healthcare Providers:  SeriousBroker.it  This test is no t yet approved or cleared by the [place] FDA and  has been authorized for detection and/or diagnosis of SARS-CoV-2 by FDA under an Emergency Use Authorization (EUA). This EUA will remain  in effect (meaning this test can be used) for the duration of the COVID-19 declaration under Section 564(b)(1) of the Act, 21 U.S.C.section 360bbb-3(b)(1), unless the authorization is terminated  or revoked sooner.       Influenza A by PCR NEGATIVE NEGATIVE Final   Influenza B by PCR NEGATIVE NEGATIVE Final    Comment: (NOTE) The Xpert Xpress SARS-CoV-2/FLU/RSV plus assay is intended as an aid in the diagnosis of influenza from Nasopharyngeal swab specimens and should not be used as a sole basis for treatment. Nasal washings and aspirates are unacceptable for Xpert Xpress SARS-CoV-2/FLU/RSV testing.  Fact Sheet for Patients: BloggerCourse.com  Fact Sheet for Healthcare Providers: SeriousBroker.it  This test is not yet approved or cleared by the [place] FDA and has been  authorized for detection and/or diagnosis of SARS-CoV-2 by FDA under an Emergency Use Authorization (EUA). This EUA will remain in effect (meaning this test can be used) for the duration of the COVID-19 declaration under Section 564(b)(1) of the Act, 21 U.S.C. section 360bbb-3(b)(1), unless the authorization is terminated or revoked.  Performed at [facility], [address] [place], [place] [address]   Urine Culture     Status: Abnormal   Collection Time: 07/09/22  9:11 AM   Specimen: Urine, Clean Catch  Result Value Ref Range Status   Specimen Description   Final    URINE, CLEAN CATCH Performed at [facility], [address] [place], [place] [address]    Special Requests   Final    NONE Performed at [facility],  [address] [place], [place] [address]    Culture MULTIPLE SPECIES PRESENT, SUGGEST RECOLLECTION (A)  Final   Report Status 07/10/2022 FINAL  Final  Culture, blood (Routine X 2) w Reflex to ID Panel     Status: None (Preliminary result)   Collection Time: 07/09/22  1:21 PM   Specimen: BLOOD  Result Value Ref Range Status   Specimen Description   Final    BLOOD LEFT ANTECUBITAL Performed at [facility] [place], [address] [place], [place] [address]    Special Requests   Final    BOTTLES DRAWN AEROBIC AND ANAEROBIC Blood Culture results may not be optimal due to an excessive volume of blood received in culture bottles Performed at [facility] [place], [address] [place], [place] [address]    Culture   Final    NO GROWTH 2 DAYS Performed at [facility], [address] [place], [place] [address]    Report Status PENDING  Incomplete  Culture, blood (Routine X 2) w Reflex to ID Panel     Status: None (Preliminary result)   Collection Time: 07/09/22  1:21 PM   Specimen: BLOOD RIGHT FOREARM  Result Value Ref Range Status   Specimen Description   Final    BLOOD RIGHT FOREARM Performed at [facility]  [place], [address] [place], [place] [address]    Special Requests   Final    BOTTLES DRAWN AEROBIC AND ANAEROBIC Blood Culture results may not be optimal due to an excessive volume of blood received in culture bottles Performed at [facility] [place], [address] [place], [place] [address]    Culture   Final    NO GROWTH 2 DAYS Performed at [facility], [address] [place], [place] [address]    Report Status PENDING  Incomplete  MRSA Next Gen by PCR, Nasal     Status: None   Collection Time: 07/10/22  5:48 AM   Specimen: Nasal Mucosa; Nasal Swab  Result Value Ref Range Status   MRSA by PCR Next Gen NOT DETECTED NOT DETECTED Final    Comment: (NOTE) The GeneXpert MRSA Assay (FDA approved for NASAL specimens only), is one component of a comprehensive MRSA colonization surveillance program. It is not intended to diagnose MRSA infection nor to guide or monitor treatment for MRSA infections. Test performance is not FDA approved in patients less than 78 years old. Performed at [facility], [address] [place], [place] [address]   Respiratory (~20 pathogens) panel by PCR     Status: None   Collection Time: 07/10/22 12:21 PM   Specimen: Nasopharyngeal Swab; Respiratory  Result Value Ref Range Status   Adenovirus NOT DETECTED NOT DETECTED Final   Coronavirus 229E NOT DETECTED NOT DETECTED Final    Comment: (NOTE) The Coronavirus on the Respiratory Panel, DOES NOT test for the novel  Coronavirus (2019 nCoV)    Coronavirus HKU1 NOT DETECTED NOT DETECTED Final   Coronavirus NL63 NOT DETECTED NOT DETECTED Final   Coronavirus OC43 NOT DETECTED NOT DETECTED Final   Metapneumovirus NOT DETECTED NOT DETECTED Final   Rhinovirus / Enterovirus NOT DETECTED NOT DETECTED Final   Influenza A NOT DETECTED NOT DETECTED Final   Influenza B NOT DETECTED NOT DETECTED Final   Parainfluenza Virus 1 NOT DETECTED NOT DETECTED Final   Parainfluenza  Virus 2 NOT DETECTED NOT DETECTED Final   Parainfluenza Virus 3  NOT DETECTED NOT DETECTED Final   Parainfluenza Virus 4 NOT DETECTED NOT DETECTED Final   Respiratory Syncytial Virus NOT DETECTED NOT DETECTED Final   Bordetella pertussis NOT DETECTED NOT DETECTED Final   Bordetella Parapertussis NOT DETECTED NOT DETECTED Final   Chlamydophila pneumoniae NOT DETECTED NOT DETECTED Final   Mycoplasma pneumoniae NOT DETECTED NOT DETECTED Final    Comment: Performed at [facility], [address] [place], [place] [address]     Labs: BNP (last 3 results) Recent Labs    07/10/22 0628  BNP 50.4   Basic Metabolic Panel: Recent Labs  Lab 07/09/22 0906 07/10/22 0628 07/11/22 0642  NA 134* 138 133*  K 3.6 3.7 3.5  CL 99 105 101  CO2 GLUCOSE 119* 123* 102*  BUN CREATININE 0.86 0.68 0.73  CALCIUM 8.4* 8.6* 7.9*  MG  --   --  2.1   Liver Function Tests: Recent Labs  Lab 07/09/22 0906  AST 26  ALT 16  ALKPHOS 62  BILITOT 0.8  PROT 7.1  ALBUMIN 3.2*   Recent Labs  Lab 07/09/22 0906  LIPASE 27   No results for input(s): "AMMONIA" in the last 168 hours. CBC: Recent Labs  Lab 07/09/22 0906 07/10/22 0628 07/11/22 0642  WBC 12.0* 12.2* 9.7  HGB 12.2 12.4 10.6*  HCT 36.2 38.5 33.0*  MCV 91.9 95.5 95.9  PLT 250 289 283   Cardiac Enzymes: No results for input(s): "CKTOTAL", "CKMB", "CKMBINDEX", "TROPONINI" in the last 168 hours. BNP: Invalid input(s): "POCBNP" CBG: No results for input(s): "GLUCAP" in the last 168 hours. D-Dimer No results for input(s): "DDIMER" in the last 72 hours. Hgb A1c No results for input(s): "HGBA1C" in the last 72 hours. Lipid Profile No results for input(s): "CHOL", "HDL", "LDLCALC", "TRIG", "CHOLHDL", "LDLDIRECT" in the last 72 hours. Thyroid function studies No results for input(s): "TSH", "T4TOTAL", "T3FREE", "THYROIDAB" in the last 72 hours.  Invalid input(s): "FREET3" Anemia work up No results for  input(s): "VITAMINB12", "FOLATE", "FERRITIN", "TIBC", "IRON", "RETICCTPCT" in the last 72 hours. Urinalysis    Component Value Date/Time   COLORURINE AMBER (A) 07/09/2022 0911   APPEARANCEUR HAZY (A) 07/09/2022 0911   LABSPEC 1.025 07/09/2022 0911   PHURINE 5.5 07/09/2022 0911   GLUCOSEU NEGATIVE 07/09/2022 0911   HGBUR TRACE (A) 07/09/2022 0911   BILIRUBINUR SMALL (A) 07/09/2022 0911   KETONESUR NEGATIVE 07/09/2022 0911   PROTEINUR 100 (A) 07/09/2022 0911   NITRITE POSITIVE (A) 07/09/2022 0911   LEUKOCYTESUR LARGE (A) 07/09/2022 0911   Sepsis Labs Recent Labs  Lab 07/09/22 0906 07/10/22 0628 07/11/22 0642  WBC 12.0* 12.2* 9.7   Microbiology Recent Results (from the past 240 hour(s))  Resp Panel by RT-PCR (Flu A&B, Covid) Anterior Nasal Swab     Status: None   Collection Time: 07/09/22  9:06 AM   Specimen: Anterior Nasal Swab  Result Value Ref Range Status   SARS Coronavirus 2 by RT PCR NEGATIVE NEGATIVE Final    Comment: (NOTE) SARS-CoV-2 target nucleic acids are NOT DETECTED.  The SARS-CoV-2 RNA is generally detectable in upper respiratory specimens during the acute phase of infection. The lowest concentration of SARS-CoV-2 viral copies this assay can detect is 138 copies/mL. A negative result does not preclude SARS-Cov-2 infection and should not be used as the sole basis for treatment or other patient management decisions. A negative result may occur with  improper specimen collection/handling, submission of specimen other than nasopharyngeal  swab, presence of viral mutation(s) within the areas targeted by this assay, and inadequate number of viral copies(<138 copies/mL). A negative result must be combined with clinical observations, patient history, and epidemiological information. The expected result is Negative.  Fact Sheet for Patients:  BloggerCourse.com  Fact Sheet for Healthcare Providers:   SeriousBroker.it  This test is no t yet approved or cleared by the [place] FDA and  has been authorized for detection and/or diagnosis of SARS-CoV-2 by FDA under an Emergency Use Authorization (EUA). This EUA will remain  in effect (meaning this test can be used) for the duration of the COVID-19 declaration under Section 564(b)(1) of the Act, 21 U.S.C.section 360bbb-3(b)(1), unless the authorization is terminated  or revoked sooner.       Influenza A by PCR NEGATIVE NEGATIVE Final   Influenza B by PCR NEGATIVE NEGATIVE Final    Comment: (NOTE) The Xpert Xpress SARS-CoV-2/FLU/RSV plus assay is intended as an aid in the diagnosis of influenza from Nasopharyngeal swab specimens and should not be used as a sole basis for treatment. Nasal washings and aspirates are unacceptable for Xpert Xpress SARS-CoV-2/FLU/RSV testing.  Fact Sheet for Patients: BloggerCourse.com  Fact Sheet for Healthcare Providers: SeriousBroker.it  This test is not yet approved or cleared by the [place] FDA and has been authorized for detection and/or diagnosis of SARS-CoV-2 by FDA under an Emergency Use Authorization (EUA). This EUA will remain in effect (meaning this test can be used) for the duration of the COVID-19 declaration under Section 564(b)(1) of the Act, 21 U.S.C. section 360bbb-3(b)(1), unless the authorization is terminated or revoked.  Performed at [facility], [address] [place], [place] [address]   Urine Culture     Status: Abnormal   Collection Time: 07/09/22  9:11 AM   Specimen: Urine, Clean Catch  Result Value Ref Range Status   Specimen Description   Final    URINE, CLEAN CATCH Performed at [facility], [address] [place], [place] [address]    Special Requests   Final    NONE Performed at [facility], [address] [place], [place] [address]     Culture MULTIPLE SPECIES PRESENT, SUGGEST RECOLLECTION (A)  Final   Report Status 07/10/2022 FINAL  Final  Culture, blood (Routine X 2) w Reflex to ID Panel     Status: None (Preliminary result)   Collection Time: 07/09/22  1:21 PM   Specimen: BLOOD  Result Value Ref Range Status   Specimen Description   Final    BLOOD LEFT ANTECUBITAL Performed at [facility], [address] [place], [place] [address]    Special Requests   Final    BOTTLES DRAWN AEROBIC AND ANAEROBIC Blood Culture results may not be optimal due to an excessive volume of blood received in culture bottles Performed at [facility], [address] [place], [place] [address]    Culture   Final    NO GROWTH 2 DAYS Performed at [facility], [address] [place], [place] [address]    Report Status PENDING  Incomplete  Culture, blood (Routine X 2) w Reflex to ID Panel     Status: None (Preliminary result)   Collection Time: 07/09/22  1:21 PM   Specimen: BLOOD RIGHT FOREARM  Result Value Ref Range Status   Specimen Description   Final    BLOOD RIGHT FOREARM Performed at [facility],  [address] [place], [place] [address]    Special Requests   Final    BOTTLES DRAWN AEROBIC AND ANAEROBIC Blood Culture results may not be optimal due to an excessive volume of blood received in culture bottles Performed at [facility], [address] [place], [place] [address]    Culture   Final    NO GROWTH 2 DAYS Performed at [facility], [address] [place], [place] [address]    Report Status PENDING  Incomplete  MRSA Next Gen by PCR, Nasal     Status: None   Collection Time: 07/10/22  5:48 AM   Specimen: Nasal Mucosa; Nasal Swab  Result Value Ref Range Status   MRSA by PCR Next Gen NOT DETECTED NOT DETECTED Final    Comment: (NOTE) The GeneXpert MRSA Assay (FDA approved for NASAL specimens only), is one component of a comprehensive MRSA colonization  surveillance program. It is not intended to diagnose MRSA infection nor to guide or monitor treatment for MRSA infections. Test performance is not FDA approved in patients less than 45 years old. Performed at [facility], [address] [place], [place] [address]   Respiratory (~20 pathogens) panel by PCR     Status: None   Collection Time: 07/10/22 12:21 PM   Specimen: Nasopharyngeal Swab; Respiratory  Result Value Ref Range Status   Adenovirus NOT DETECTED NOT DETECTED Final   Coronavirus 229E NOT DETECTED NOT DETECTED Final    Comment: (NOTE) The Coronavirus on the Respiratory Panel, DOES NOT test for the novel  Coronavirus (2019 nCoV)    Coronavirus HKU1 NOT DETECTED NOT DETECTED Final   Coronavirus NL63 NOT DETECTED NOT DETECTED Final   Coronavirus OC43 NOT DETECTED NOT DETECTED Final   Metapneumovirus NOT DETECTED NOT DETECTED Final   Rhinovirus / Enterovirus NOT DETECTED NOT DETECTED Final   Influenza A NOT DETECTED NOT DETECTED Final   Influenza B NOT DETECTED NOT DETECTED Final   Parainfluenza Virus 1 NOT DETECTED NOT DETECTED Final   Parainfluenza Virus 2 NOT DETECTED NOT DETECTED Final   Parainfluenza Virus 3 NOT DETECTED NOT DETECTED Final   Parainfluenza Virus 4 NOT DETECTED NOT DETECTED Final   Respiratory Syncytial Virus NOT DETECTED NOT DETECTED Final   Bordetella pertussis NOT DETECTED NOT DETECTED Final   Bordetella Parapertussis NOT DETECTED NOT DETECTED Final   Chlamydophila pneumoniae NOT DETECTED NOT DETECTED Final   Mycoplasma pneumoniae NOT DETECTED NOT DETECTED Final    Comment: Performed at [facility], [address] [place], [place] [address]     Time coordinating discharge:  I have spent 35 minutes face to face with the patient and on the ward discussing the patients care, assessment, plan and disposition with other care givers. >50% of the time was devoted counseling the patient about the risks and benefits of  treatment/Discharge disposition and coordinating care.   SIGNED:   Dimple Nanas, MD  [facility] 07/11/2022, 12:11 PM   If 7PM-7AM, please contact night-coverage

## 2022-07-14 LAB — CULTURE, BLOOD (ROUTINE X 2)
Culture: NO GROWTH
Culture: NO GROWTH
